# Patient Record
Sex: Female | Born: 1985 | Race: Black or African American | Hispanic: No | Marital: Married | Smoking: Former smoker
Health system: Southern US, Community
[De-identification: ages and names within clinical notes are randomized; demographics above are authoritative.]

## PROBLEM LIST (undated history)

## (undated) ENCOUNTER — Inpatient Hospital Stay (HOSPITAL_COMMUNITY): Payer: Self-pay

## (undated) DIAGNOSIS — A749 Chlamydial infection, unspecified: Secondary | ICD-10-CM

## (undated) DIAGNOSIS — D649 Anemia, unspecified: Secondary | ICD-10-CM

## (undated) DIAGNOSIS — A549 Gonococcal infection, unspecified: Secondary | ICD-10-CM

## (undated) HISTORY — PX: ECTOPIC PREGNANCY SURGERY: SHX613

---

## 2007-08-13 ENCOUNTER — Emergency Department (HOSPITAL_COMMUNITY): Admission: EM | Admit: 2007-08-13 | Discharge: 2007-08-13 | Payer: Self-pay | Admitting: Emergency Medicine

## 2007-10-27 ENCOUNTER — Emergency Department (HOSPITAL_COMMUNITY): Admission: EM | Admit: 2007-10-27 | Discharge: 2007-10-27 | Payer: Self-pay | Admitting: Emergency Medicine

## 2007-11-05 ENCOUNTER — Emergency Department (HOSPITAL_COMMUNITY): Admission: EM | Admit: 2007-11-05 | Discharge: 2007-11-05 | Payer: Self-pay | Admitting: Emergency Medicine

## 2007-11-10 ENCOUNTER — Emergency Department (HOSPITAL_COMMUNITY): Admission: EM | Admit: 2007-11-10 | Discharge: 2007-11-10 | Payer: Self-pay | Admitting: Emergency Medicine

## 2007-12-05 ENCOUNTER — Emergency Department (HOSPITAL_COMMUNITY): Admission: EM | Admit: 2007-12-05 | Discharge: 2007-12-05 | Payer: Self-pay | Admitting: Emergency Medicine

## 2008-01-24 ENCOUNTER — Emergency Department (HOSPITAL_COMMUNITY): Admission: EM | Admit: 2008-01-24 | Discharge: 2008-01-24 | Payer: Self-pay | Admitting: Emergency Medicine

## 2008-03-09 ENCOUNTER — Emergency Department (HOSPITAL_COMMUNITY): Admission: EM | Admit: 2008-03-09 | Discharge: 2008-03-09 | Payer: Self-pay | Admitting: Emergency Medicine

## 2008-03-20 ENCOUNTER — Emergency Department (HOSPITAL_COMMUNITY): Admission: EM | Admit: 2008-03-20 | Discharge: 2008-03-20 | Payer: Self-pay | Admitting: Emergency Medicine

## 2008-09-05 ENCOUNTER — Emergency Department (HOSPITAL_COMMUNITY): Admission: EM | Admit: 2008-09-05 | Discharge: 2008-09-05 | Payer: Self-pay | Admitting: Emergency Medicine

## 2009-01-27 ENCOUNTER — Encounter: Payer: Self-pay | Admitting: Emergency Medicine

## 2009-01-27 ENCOUNTER — Ambulatory Visit (HOSPITAL_COMMUNITY): Admission: AD | Admit: 2009-01-27 | Discharge: 2009-01-27 | Payer: Self-pay | Admitting: Obstetrics & Gynecology

## 2009-01-27 ENCOUNTER — Encounter: Payer: Self-pay | Admitting: Obstetrics & Gynecology

## 2009-02-19 ENCOUNTER — Emergency Department (HOSPITAL_COMMUNITY): Admission: EM | Admit: 2009-02-19 | Discharge: 2009-02-19 | Payer: Self-pay | Admitting: Emergency Medicine

## 2009-06-14 IMAGING — CR DG CHEST 2V
2 series · 2 of 2 positions shown · non-contrast
Comparison: None available

CLINICAL DATA: Cough, fever

CHEST - 2 VIEW

[w chest pa]
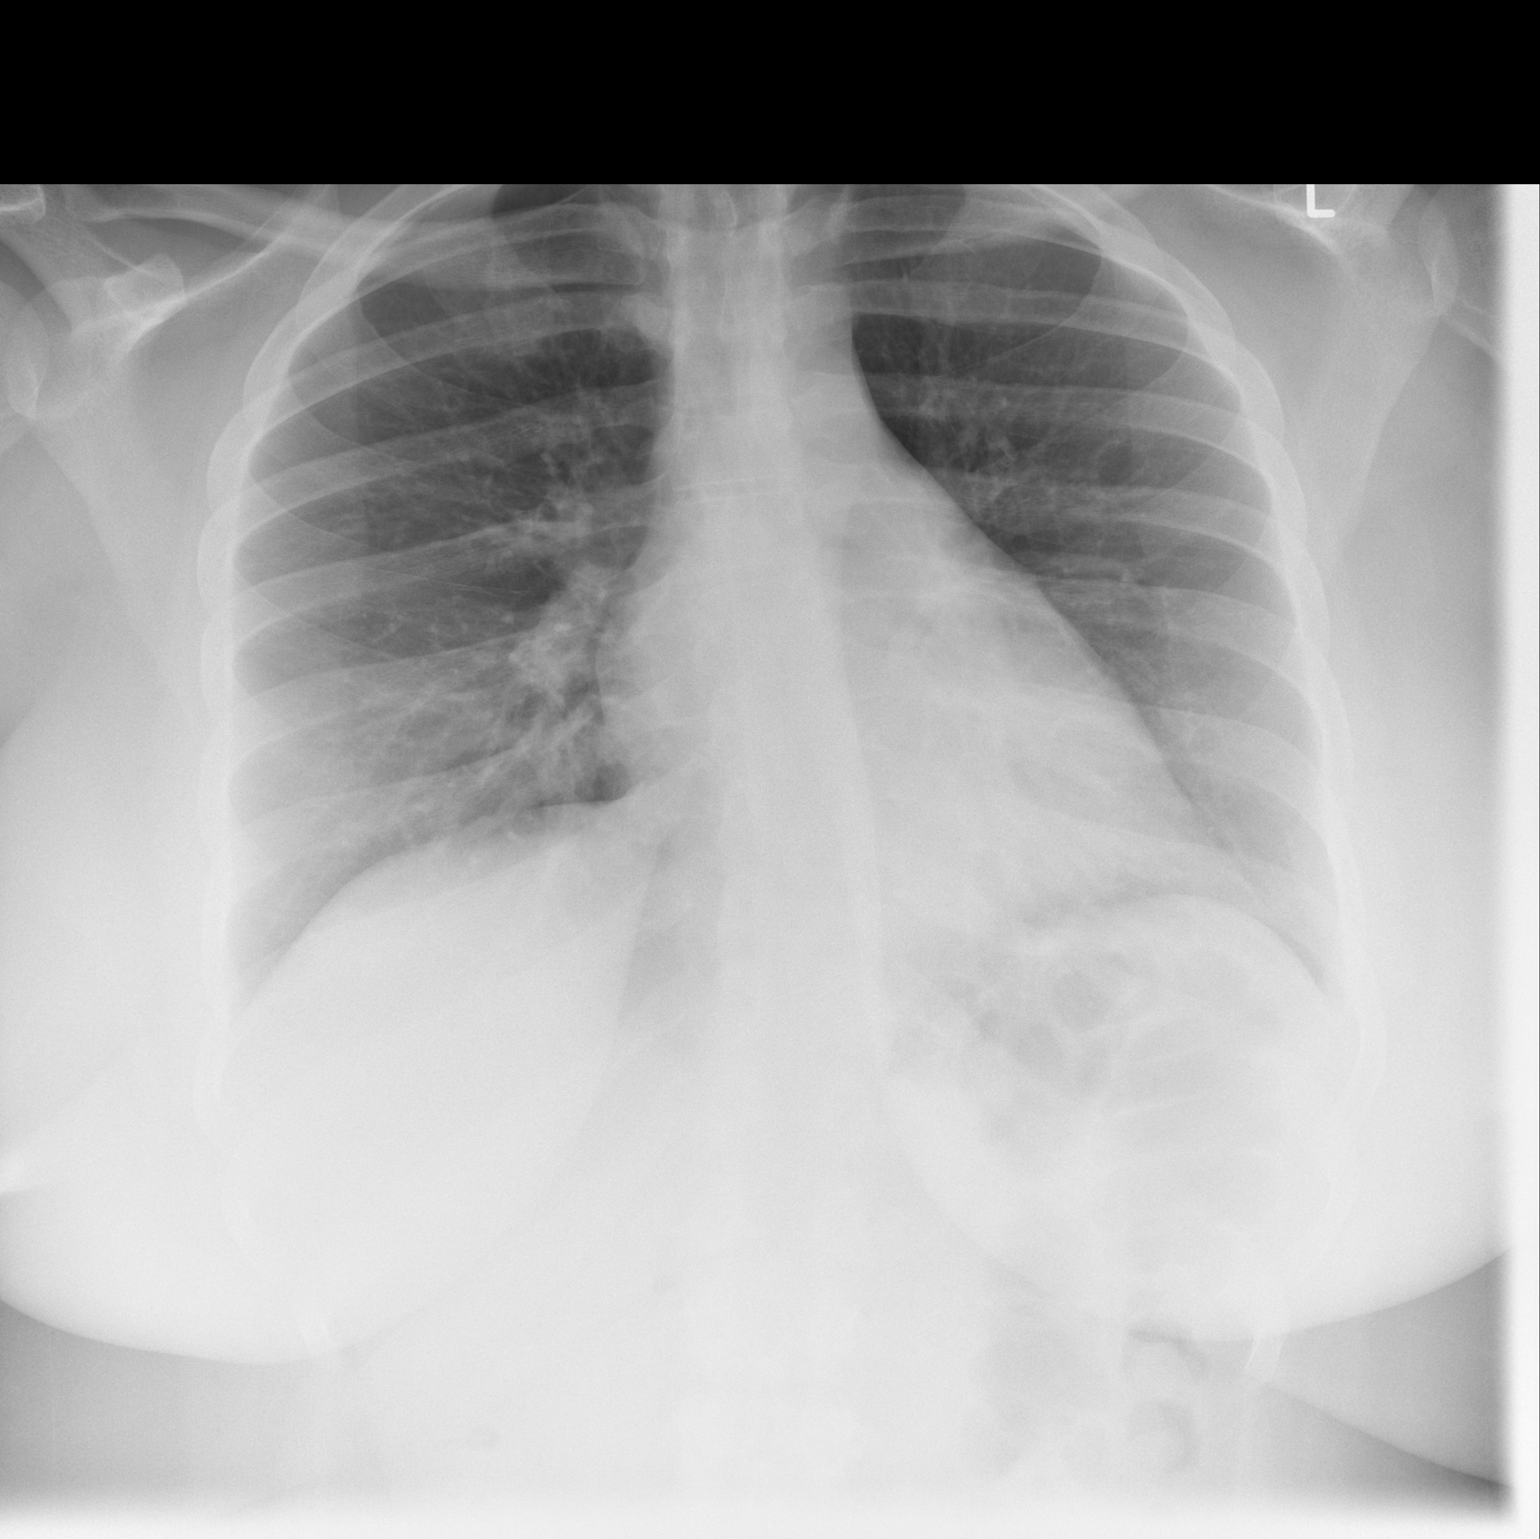

[w chest lat *]
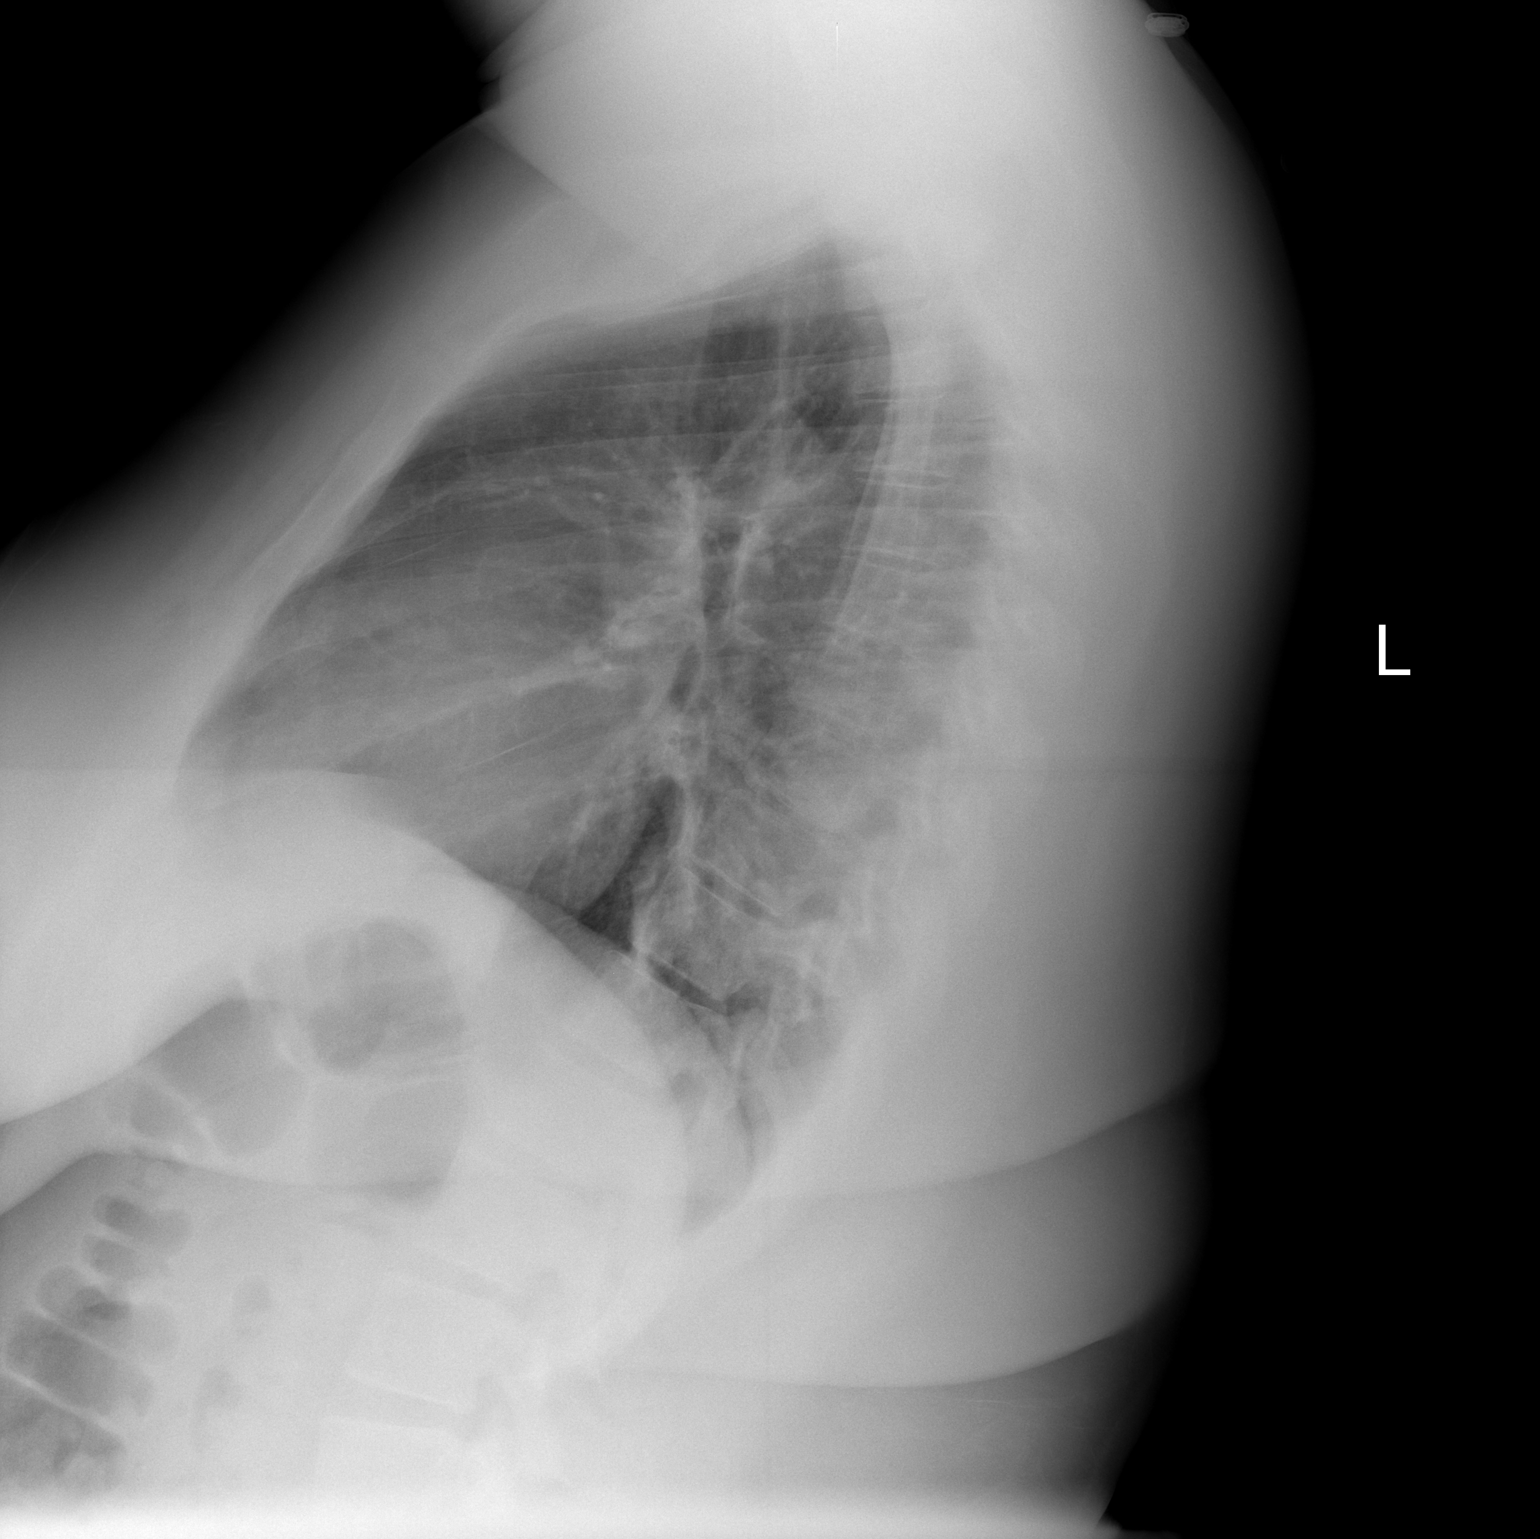

[2 of 2 positions shown; findings below may reference images not displayed]

FINDINGS: Left retrocardiac consolidation / atelectasis.  Right
lung clear.  No effusion.  Heart size normal.
IMPRESSION: 1.  Left retrocardiac consolidation or atelectasis, possibly early
pneumonia.

## 2009-10-31 ENCOUNTER — Inpatient Hospital Stay (HOSPITAL_COMMUNITY): Admission: AD | Admit: 2009-10-31 | Discharge: 2009-10-31 | Payer: Self-pay | Admitting: Obstetrics & Gynecology

## 2009-11-02 ENCOUNTER — Inpatient Hospital Stay (HOSPITAL_COMMUNITY): Admission: AD | Admit: 2009-11-02 | Discharge: 2009-11-02 | Payer: Self-pay | Admitting: Obstetrics & Gynecology

## 2009-11-07 ENCOUNTER — Ambulatory Visit (HOSPITAL_COMMUNITY): Admission: RE | Admit: 2009-11-07 | Discharge: 2009-11-07 | Payer: Self-pay | Admitting: Obstetrics & Gynecology

## 2010-11-04 LAB — GC/CHLAMYDIA PROBE AMP, GENITAL
Chlamydia, DNA Probe: NEGATIVE
GC Probe Amp, Genital: NEGATIVE

## 2010-11-04 LAB — URINALYSIS, ROUTINE W REFLEX MICROSCOPIC
Glucose, UA: NEGATIVE mg/dL
Ketones, ur: NEGATIVE mg/dL
Protein, ur: NEGATIVE mg/dL
Urobilinogen, UA: 0.2 mg/dL (ref 0.0–1.0)
pH: 6.5 (ref 5.0–8.0)

## 2010-11-04 LAB — HCG, QUANTITATIVE, PREGNANCY: hCG, Beta Chain, Quant, S: 1364 m[IU]/mL — ABNORMAL HIGH (ref <5)

## 2010-11-04 LAB — CBC
HCT: 35.9 % — ABNORMAL LOW (ref 36.0–46.0)
RBC: 4.27 MIL/uL (ref 3.87–5.11)
RDW: 14.5 % (ref 11.5–15.5)
WBC: 4.3 10*3/uL (ref 4.0–10.5)

## 2010-11-04 LAB — URINE CULTURE: Colony Count: >=100000

## 2010-11-04 LAB — POCT PREGNANCY, URINE: Preg Test, Ur: POSITIVE

## 2010-11-04 LAB — URINE MICROSCOPIC-ADD ON

## 2010-11-04 LAB — WET PREP, GENITAL: Trich, Wet Prep: NONE SEEN

## 2010-11-17 LAB — URINE MICROSCOPIC-ADD ON

## 2010-11-17 LAB — CBC
Hemoglobin: 12 g/dL (ref 12.0–15.0)
MCHC: 32.9 g/dL (ref 30.0–36.0)
Platelets: 240 10*3/uL (ref 150–400)
RDW: 14.2 % (ref 11.5–15.5)

## 2010-11-17 LAB — DIFFERENTIAL
Basophils Absolute: 0.1 10*3/uL (ref 0.0–0.1)
Basophils Relative: 1 % (ref 0–1)
Lymphocytes Relative: 35 % (ref 12–46)
Neutro Abs: 2.7 10*3/uL (ref 1.7–7.7)
Neutrophils Relative %: 56 % (ref 43–77)

## 2010-11-17 LAB — TROPONIN I: Troponin I: <0.01 ng/mL (ref 0.00–0.06)

## 2010-11-17 LAB — CK TOTAL AND CKMB (NOT AT ARMC)
CK, MB: 1 ng/mL (ref 0.3–4.0)
Relative Index: 0.6 (ref 0.0–2.5)
Total CK: 174 U/L (ref 7–177)

## 2010-11-17 LAB — WET PREP, GENITAL
Clue Cells Wet Prep HPF POC: NONE SEEN
Trich, Wet Prep: NONE SEEN

## 2010-11-17 LAB — URINE CULTURE

## 2010-11-17 LAB — URINALYSIS, ROUTINE W REFLEX MICROSCOPIC
Glucose, UA: NEGATIVE mg/dL
Ketones, ur: 15 mg/dL — AB
Nitrite: NEGATIVE
Urobilinogen, UA: 1 mg/dL (ref 0.0–1.0)

## 2010-11-17 LAB — GC/CHLAMYDIA PROBE AMP, GENITAL: Chlamydia, DNA Probe: NEGATIVE

## 2010-11-17 LAB — PROTIME-INR: Prothrombin Time: 13.8 seconds (ref 11.6–15.2)

## 2010-11-18 LAB — CBC
HCT: 39.4 % (ref 36.0–46.0)
Platelets: 177 10*3/uL (ref 150–400)
RBC: 4.73 MIL/uL (ref 3.87–5.11)
WBC: 6.7 10*3/uL (ref 4.0–10.5)

## 2010-11-18 LAB — DIFFERENTIAL
Lymphocytes Relative: 13 % (ref 12–46)
Lymphs Abs: 0.9 10*3/uL (ref 0.7–4.0)
Monocytes Relative: 5 % (ref 3–12)
Neutrophils Relative %: 81 % — ABNORMAL HIGH (ref 43–77)

## 2010-11-18 LAB — URINALYSIS, ROUTINE W REFLEX MICROSCOPIC
Glucose, UA: NEGATIVE mg/dL
Ketones, ur: NEGATIVE mg/dL
Nitrite: NEGATIVE
pH: 7 (ref 5.0–8.0)

## 2010-11-18 LAB — BASIC METABOLIC PANEL
BUN: 4 mg/dL — ABNORMAL LOW (ref 6–23)
Creatinine, Ser: 0.77 mg/dL (ref 0.4–1.2)
GFR calc Af Amer: >60 mL/min (ref >60)
GFR calc non Af Amer: >60 mL/min (ref >60)
Potassium: 3.1 mEq/L — ABNORMAL LOW (ref 3.5–5.1)

## 2010-11-18 LAB — WET PREP, GENITAL
WBC, Wet Prep HPF POC: NONE SEEN
Yeast Wet Prep HPF POC: NONE SEEN

## 2010-11-18 LAB — HCG, QUANTITATIVE, PREGNANCY: hCG, Beta Chain, Quant, S: 8157 m[IU]/mL — ABNORMAL HIGH (ref <5)

## 2010-11-18 LAB — POCT PREGNANCY, URINE: Preg Test, Ur: POSITIVE

## 2010-11-25 LAB — CBC
HCT: 35.5 % — ABNORMAL LOW (ref 36.0–46.0)
MCHC: 32.7 g/dL (ref 30.0–36.0)
MCV: 81 fL (ref 78.0–100.0)
Platelets: 215 10*3/uL (ref 150–400)

## 2010-11-25 LAB — LIPASE, BLOOD: Lipase: 18 U/L (ref 11–59)

## 2010-11-25 LAB — COMPREHENSIVE METABOLIC PANEL
AST: 17 U/L (ref 0–37)
Albumin: 2.8 g/dL — ABNORMAL LOW (ref 3.5–5.2)
BUN: 3 mg/dL — ABNORMAL LOW (ref 6–23)
CO2: 27 mEq/L (ref 19–32)
Calcium: 8.3 mg/dL — ABNORMAL LOW (ref 8.4–10.5)
Chloride: 107 mEq/L (ref 96–112)
Creatinine, Ser: 0.75 mg/dL (ref 0.4–1.2)
GFR calc Af Amer: >60 mL/min (ref >60)
GFR calc non Af Amer: >60 mL/min (ref >60)
Total Bilirubin: 0.8 mg/dL (ref 0.3–1.2)

## 2010-11-25 LAB — URINALYSIS, ROUTINE W REFLEX MICROSCOPIC
Bilirubin Urine: NEGATIVE
Glucose, UA: NEGATIVE mg/dL
Hgb urine dipstick: NEGATIVE
Ketones, ur: 40 mg/dL — AB
Nitrite: NEGATIVE
Urobilinogen, UA: 1 mg/dL (ref 0.0–1.0)
pH: 7 (ref 5.0–8.0)

## 2010-11-25 LAB — WET PREP, GENITAL: Trich, Wet Prep: NONE SEEN

## 2010-11-25 LAB — URINE MICROSCOPIC-ADD ON

## 2010-11-25 LAB — DIFFERENTIAL
Basophils Absolute: 0 10*3/uL (ref 0.0–0.1)
Eosinophils Relative: 1 % (ref 0–5)
Lymphocytes Relative: 14 % (ref 12–46)
Lymphs Abs: 0.7 10*3/uL (ref 0.7–4.0)
Neutro Abs: 3.7 10*3/uL (ref 1.7–7.7)

## 2010-12-24 NOTE — Op Note (Signed)
[name], Samantha Lewis                ACCOUNT NO.:  [id number]   MEDICAL RECORD NO.:  [id number]          PATIENT TYPE:  AMB   LOCATION:  SDC                           FACILITY:  [facility]   PHYSICIAN:  Roseanna Rainbow, M.D.DATE OF BIRTH:  [birth year]   DATE OF PROCEDURE:  01/27/2009  DATE OF DISCHARGE:                               OPERATIVE REPORT   PREOPERATIVE DIAGNOSIS:  Rule out ruptured tubal ectopic pregnancy.   POSTOPERATIVE DIAGNOSIS:  Right-sided tubal ectopic pregnancy, ruptured.   PROCEDURE:  Laparoscopic right salpingectomy.   SURGEON:  Roseanna Rainbow, MD   ANESTHESIA:  General endotracheal.   FINDINGS:  Right tubal ectopic pregnancy, ruptured small hemoperitoneum  approximately 100 mL.   SPECIMEN:  Left fallopian tube and products of conception to Pathology.   ESTIMATED BLOOD LOSS:  Minimal.   COMPLICATIONS:  None.   PROCEDURES:  The patient was taken to the operating room with an IV  running.  She was given general anesthesia.  She was placed in the  dorsal lithotomy position and prepped and draped in the usual sterile  fashion.  After a time-out had been completed, a supraumbilical skin  incision was then made with the scalpel.  Using the OptiVu, a 10-mm  trocar and sleeve were then advanced into the abdomen under direct  visualization.  A 5-mm left lower quadrant port and a 10-mm suprapubic  port were then placed under direct visualization after the abdomen had  been insufflated with CO2 gas.  A survey of the pelvic viscera revealed  the above findings.  The ampullary portion of the tube on the right was  distended with blood and products of conception.  A uterine manipulator  had not been placed in light of the possibility of an intrauterine  pregnancy.  The left adnexa was not well visualized.  Using the Enseal  bipolar device, the mesosalpinx was cauterized and transected.  The tube  was then placed in an EndoCatch.  The pelvis was copiously  irrigated.  The operative site was felt to be hemostatic.  The intra-abdominal  pressure was allowed to decrease and hemostasis was maintained.  The  specimen was removed from the abdomen with an EndoCatch.  All the  trocars and sleeves were then removed from the abdomen.  The fascial  incision for the  suprapubic port was reapproximated with a figure-of-eight suture of O  Vicryl.  The skin incisions were reapproximated using Dermabond.  At the  close of the procedure, the instrument and pack counts were said to be  correct x2.  The patient was taken to the PACU awake and in stable  condition.      Roseanna Rainbow, M.D.  Electronically Signed     LAJ/MEDQ  D:  01/27/2009  T:  01/27/2009  Job:  [id number]

## 2011-03-15 ENCOUNTER — Emergency Department (HOSPITAL_COMMUNITY)
Admission: EM | Admit: 2011-03-15 | Discharge: 2011-03-15 | Disposition: A | Discharge status: Home / Self Care | Payer: Medicaid Other | Attending: Podiatry | Admitting: Podiatry

## 2011-03-15 DIAGNOSIS — F329 Major depressive disorder, single episode, unspecified: Secondary | ICD-10-CM | POA: Insufficient documentation

## 2011-03-15 DIAGNOSIS — N898 Other specified noninflammatory disorders of vagina: Secondary | ICD-10-CM | POA: Insufficient documentation

## 2011-03-15 DIAGNOSIS — F3289 Other specified depressive episodes: Secondary | ICD-10-CM | POA: Insufficient documentation

## 2011-03-15 DIAGNOSIS — L293 Anogenital pruritus, unspecified: Secondary | ICD-10-CM | POA: Insufficient documentation

## 2011-03-15 DIAGNOSIS — N949 Unspecified condition associated with female genital organs and menstrual cycle: Secondary | ICD-10-CM | POA: Insufficient documentation

## 2011-03-15 LAB — WET PREP, GENITAL

## 2011-03-15 LAB — URINE MICROSCOPIC-ADD ON

## 2011-03-15 LAB — URINALYSIS, ROUTINE W REFLEX MICROSCOPIC
Glucose, UA: NEGATIVE mg/dL
Ketones, ur: 15 mg/dL — AB
Specific Gravity, Urine: 1.027 (ref 1.005–1.030)
pH: 5.5 (ref 5.0–8.0)

## 2011-05-01 LAB — URINALYSIS, ROUTINE W REFLEX MICROSCOPIC
Glucose, UA: NEGATIVE
Protein, ur: 30 — AB
Specific Gravity, Urine: 1.03

## 2011-05-01 LAB — URINE MICROSCOPIC-ADD ON

## 2011-05-01 LAB — GC/CHLAMYDIA PROBE AMP, GENITAL: Chlamydia, DNA Probe: NEGATIVE

## 2011-05-01 LAB — RPR: RPR Ser Ql: NONREACTIVE

## 2011-05-01 LAB — WET PREP, GENITAL

## 2011-05-05 LAB — POCT RAPID STREP A: Streptococcus, Group A Screen (Direct): NEGATIVE

## 2011-05-06 LAB — POCT I-STAT, CHEM 8
Calcium, Ion: 1.22
Glucose, Bld: 89
HCT: 37
Hemoglobin: 12.6

## 2011-05-06 LAB — URINALYSIS, ROUTINE W REFLEX MICROSCOPIC
Bilirubin Urine: NEGATIVE
Glucose, UA: NEGATIVE
Ketones, ur: NEGATIVE
pH: 6

## 2011-05-06 LAB — WET PREP, GENITAL

## 2011-05-06 LAB — URINE MICROSCOPIC-ADD ON

## 2011-05-06 LAB — GC/CHLAMYDIA PROBE AMP, GENITAL
Chlamydia, DNA Probe: POSITIVE — AB
GC Probe Amp, Genital: NEGATIVE

## 2011-05-08 LAB — WET PREP, GENITAL
Clue Cells Wet Prep HPF POC: NONE SEEN
WBC, Wet Prep HPF POC: NONE SEEN
Yeast Wet Prep HPF POC: NONE SEEN

## 2011-05-08 LAB — URINALYSIS, ROUTINE W REFLEX MICROSCOPIC
Nitrite: NEGATIVE
Specific Gravity, Urine: 1.028
pH: 5.5

## 2011-05-08 LAB — URINE MICROSCOPIC-ADD ON

## 2011-05-08 LAB — POCT PREGNANCY, URINE: Preg Test, Ur: NEGATIVE

## 2011-05-09 LAB — COMPREHENSIVE METABOLIC PANEL
AST: 18
Albumin: 2.9 — ABNORMAL LOW
Chloride: 105
Creatinine, Ser: 0.76
GFR calc Af Amer: >60
Total Bilirubin: 0.6
Total Protein: 5.7 — ABNORMAL LOW

## 2011-05-09 LAB — CBC
MCV: 77.9 — ABNORMAL LOW
Platelets: 232
WBC: 6

## 2011-05-09 LAB — WET PREP, GENITAL

## 2011-05-09 LAB — GC/CHLAMYDIA PROBE AMP, GENITAL
Chlamydia, DNA Probe: POSITIVE — AB
GC Probe Amp, Genital: NEGATIVE

## 2011-05-09 LAB — DIFFERENTIAL
Basophils Absolute: 0
Eosinophils Relative: 1
Lymphocytes Relative: 32
Lymphs Abs: 1.9
Monocytes Absolute: 0.3
Monocytes Relative: 5

## 2011-05-09 LAB — RPR: RPR Ser Ql: NONREACTIVE

## 2011-05-09 LAB — URINALYSIS, ROUTINE W REFLEX MICROSCOPIC
Ketones, ur: 15 — AB
Nitrite: NEGATIVE
Protein, ur: 30 — AB
Specific Gravity, Urine: 1.026
Urobilinogen, UA: 0.2

## 2011-05-09 LAB — POCT PREGNANCY, URINE: Preg Test, Ur: NEGATIVE

## 2011-05-09 LAB — PREGNANCY, URINE: Preg Test, Ur: NEGATIVE

## 2011-05-09 LAB — URINE MICROSCOPIC-ADD ON

## 2011-11-20 ENCOUNTER — Inpatient Hospital Stay (HOSPITAL_COMMUNITY)
Admission: AD | Admit: 2011-11-20 | Discharge: 2011-11-20 | Disposition: A | Discharge status: Home / Self Care | Payer: Medicaid Other | Attending: Obstetrics and Gynecology | Admitting: Obstetrics and Gynecology

## 2011-11-20 ENCOUNTER — Encounter (HOSPITAL_COMMUNITY): Payer: Self-pay | Admitting: *Deleted

## 2011-11-20 DIAGNOSIS — N949 Unspecified condition associated with female genital organs and menstrual cycle: Secondary | ICD-10-CM | POA: Insufficient documentation

## 2011-11-20 DIAGNOSIS — R109 Unspecified abdominal pain: Secondary | ICD-10-CM | POA: Insufficient documentation

## 2011-11-20 DIAGNOSIS — N898 Other specified noninflammatory disorders of vagina: Secondary | ICD-10-CM

## 2011-11-20 DIAGNOSIS — K589 Irritable bowel syndrome without diarrhea: Secondary | ICD-10-CM

## 2011-11-20 LAB — URINALYSIS, ROUTINE W REFLEX MICROSCOPIC
Bilirubin Urine: NEGATIVE
Hgb urine dipstick: NEGATIVE
Ketones, ur: NEGATIVE mg/dL
Nitrite: NEGATIVE
Urobilinogen, UA: 1 mg/dL (ref 0.0–1.0)

## 2011-11-20 LAB — WET PREP, GENITAL
Clue Cells Wet Prep HPF POC: NONE SEEN
Yeast Wet Prep HPF POC: NONE SEEN

## 2011-11-20 NOTE — MAU Provider Note (Signed)
  History     CSN: [id number]  Arrival date and time: 11/20/11 4098   First Provider Initiated Contact with Patient 11/20/11 0420      No chief complaint on file.  HPI This is a 26 y.o. female who presents with c/o vaginal discharge that "sometimes stinks and sometimes not".  Also c/o colicky, crampy "gas pains" all over abdomen for one month.  Worse when eats broccoli and cabbage, which she eats a lot.   LMP was 3 weeks ago. Has OB/GYN in [place], but did not call them "because they are too far away" (she lives here now). No unusual bleeding.  OB History    Grav Para Term Preterm Abortions TAB SAB Ect Mult Living   4    2 1  1  2       History reviewed. No pertinent past medical history.  Past Surgical History  Procedure Date  . Ectopic pregnancy surgery     Family History  Problem Relation Age of Onset  . Asthma Mother   . Depression Mother   . Arthritis Mother   . Hypertension Mother     History  Substance Use Topics  . Smoking status: Current Everyday Smoker  . Smokeless tobacco: Not on file  . Alcohol Use: No    Allergies:  Allergies  Allergen Reactions  . Ultram (Tramadol Hcl) Nausea And Vomiting    Prescriptions prior to admission  Medication Sig Dispense Refill  . acetaminophen (TYLENOL) 325 MG tablet Take 650 mg by mouth every 6 (six) hours as needed. For pain or headache      . ferrous sulfate 325 (65 FE) MG tablet Take 325 mg by mouth daily with breakfast.      . ibuprofen (ADVIL,MOTRIN) 200 MG tablet Take 200 mg by mouth every 6 (six) hours as needed. For pain or headache      . Prenatal Vit-Fe Fumarate-FA (PRENATAL MULTIVITAMIN) TABS Take 1 tablet by mouth daily.        ROS As listed above  Physical Exam   Blood pressure 112/64, pulse 67, temperature 99.4 F (37.4 C), temperature source Oral, resp. rate 20, height 5\' 7"  (1.702 m), weight 234 lb (106.142 kg), last menstrual period 10/31/2011.  Physical Exam  Constitutional: She is oriented  to person, place, and time. She appears well-developed and well-nourished. No distress.  HENT:  Head: Normocephalic.  Cardiovascular: Normal rate.   Respiratory: Effort normal.  GI: Soft. She exhibits no distension and no mass. There is no tenderness. There is no rebound and no guarding.  Genitourinary: Uterus normal. Vaginal discharge (thick white) found.  Musculoskeletal: Normal range of motion.  Neurological: She is alert and oriented to person, place, and time.  Skin: Skin is warm and dry.  Psychiatric: She has a normal mood and affect.    MAU Course  Procedures  MDM GC/Chl done per request.  Wet Prep:  Only WBCs   GC/Chl pending  Assessment and Plan  A:  Abdominal discomfort, probably IBS       Vaginal discharge, normal wet prep  P:  Discussed IBS and need for family doctor to fully assess      Suggest getting GYN doctor here in town  [facility] 11/20/2011, 4:27 AM

## 2011-11-20 NOTE — Discharge Instructions (Signed)
Irritable Bowel Syndrome Irritable Bowel Syndrome (IBS) is caused by a disturbance of normal bowel function. Other terms used are spastic colon, mucous colitis, and irritable colon. It does not require surgery, nor does it lead to cancer. There is no cure for IBS. But with proper diet, stress reduction, and medication, you will find that your problems (symptoms) will gradually disappear or improve. IBS is a common digestive disorder. It usually appears in late adolescence or early adulthood. Women develop it twice as often as men. CAUSES   After food has been digested and absorbed in the small intestine, waste material is moved into the colon (large intestine). In the colon, water and salts are absorbed from the undigested products coming from the small intestine. The remaining residue, or fecal material, is held for elimination. Under normal circumstances, gentle, rhythmic contractions on the bowel walls push the fecal material along the colon towards the rectum. In IBS, however, these contractions are irregular and poorly coordinated. The fecal material is either retained too long, resulting in constipation, or expelled too soon, producing diarrhea. SYMPTOMS   The most common symptom of IBS is pain. It is typically in the lower left side of the belly (abdomen). But it may occur anywhere in the abdomen. It can be felt as heartburn, backache, or even as a dull pain in the arms or shoulders. The pain comes from excessive bowel-muscle spasms and from the buildup of gas and fecal material in the colon. This pain:  Can range from sharp belly (abdominal) cramps to a dull, continuous ache.   Usually worsens soon after eating.   Is typically relieved by having a bowel movement or passing gas.  Abdominal pain is usually accompanied by constipation. But it may also produce diarrhea. The diarrhea typically occurs right after a meal or upon arising in the morning. The stools are typically soft and watery. They are  often flecked with secretions (mucus). Other symptoms of IBS include:  Bloating.   Loss of appetite.   Heartburn.   Feeling sick to your stomach (nausea).   Belching   Vomiting   Gas.  IBS may also cause a number of symptoms that are unrelated to the digestive system:  Fatigue.   Headaches.   Anxiety   Shortness of breath   Difficulty in concentrating.   Dizziness.  These symptoms tend to come and go. DIAGNOSIS   The symptoms of IBS closely mimic the symptoms of other, more serious digestive disorders. So your caregiver may wish to perform a variety of additional tests to exclude these disorders. He/she wants to be certain of learning what is wrong (diagnosis). The nature and purpose of each test will be explained to you. TREATMENT A number of medications are available to help correct bowel function and/or relieve bowel spasms and abdominal pain. Among the drugs available are:  Mild, non-irritating laxatives for severe constipation and to help restore normal bowel habits.   Specific anti-diarrheal medications to treat severe or prolonged diarrhea.   Anti-spasmodic agents to relieve intestinal cramps.   Your caregiver may also decide to treat you with a mild tranquilizer or sedative during unusually stressful periods in your life.  The important thing to remember is that if any drug is prescribed for you, make sure that you take it exactly as directed. Make sure that your caregiver knows how well it worked for you. HOME CARE INSTRUCTIONS    Avoid foods that are high in fat or oils. Some examples are:heavy cream, butter, frankfurters,   sausage, and other fatty meats.   Avoid foods that have a laxative effect, such as fruit, fruit juice, and dairy products.   Cut out carbonated drinks, chewing gum, and "gassy" foods, such as beans and cabbage. This may help relieve bloating and belching.   Bran taken with plenty of liquids may help relieve constipation.   Keep track  of what foods seem to trigger your symptoms.   Avoid emotionally charged situations or circumstances that produce anxiety.   Start or continue exercising.   Get plenty of rest and sleep.  MAKE SURE YOU:    Understand these instructions.   Will watch your condition.   Will get help right away if you are not doing well or get worse.  Document Released: 07/28/2005 Document Revised: 07/17/2011 Document Reviewed: 03/17/2008 Exit[employer] Patient Information 2012 [employer]. 

## 2011-11-20 NOTE — MAU Note (Signed)
PT SAYS SHE  STARTED HAVING ABD CRAMPS WITH CYCLE  LAST MTH AND CONTINUED.    FEELS  INCREASE GAS - WITH PAINS- EATING CABBAGE AND BROCCOLI.  SAYS VAG D/C STARTED  LAST Thursday.     GOES TO  DR Renee Ramus IN [place] [facility] .    NO FAMILY  DR.   Hayden Rasmussen CONDOMS FOR BIRTH CONTROL.

## 2011-11-20 NOTE — MAU Note (Signed)
Pt reports vaginal discharge x 1 week, also reports abd pain x 1 week that is worsening. LMP 10/31/2011

## 2011-11-21 LAB — GC/CHLAMYDIA PROBE AMP, GENITAL
Chlamydia, DNA Probe: POSITIVE — AB
GC Probe Amp, Genital: NEGATIVE

## 2011-11-22 NOTE — MAU Provider Note (Signed)
Attestation of Attending Supervision of Advanced Practitioner: Evaluation and management procedures were performed by the PA/NP/CNM/OB Fellow under my supervision/collaboration. Chart reviewed and agree with management and plan.  Keegan Ducey V 11/22/2011 2:40 PM

## 2012-05-07 ENCOUNTER — Emergency Department (HOSPITAL_COMMUNITY)
Admission: EM | Admit: 2012-05-07 | Discharge: 2012-05-07 | Disposition: A | Discharge status: Home / Self Care | Payer: Medicaid Other | Attending: Emergency Medicine | Admitting: Emergency Medicine

## 2012-05-07 ENCOUNTER — Encounter (HOSPITAL_COMMUNITY): Payer: Self-pay | Admitting: Physical Medicine and Rehabilitation

## 2012-05-07 DIAGNOSIS — N739 Female pelvic inflammatory disease, unspecified: Secondary | ICD-10-CM | POA: Insufficient documentation

## 2012-05-07 DIAGNOSIS — N730 Acute parametritis and pelvic cellulitis: Secondary | ICD-10-CM

## 2012-05-07 DIAGNOSIS — F17200 Nicotine dependence, unspecified, uncomplicated: Secondary | ICD-10-CM | POA: Insufficient documentation

## 2012-05-07 LAB — URINALYSIS, ROUTINE W REFLEX MICROSCOPIC
Glucose, UA: NEGATIVE mg/dL
Hgb urine dipstick: NEGATIVE
Protein, ur: NEGATIVE mg/dL
Specific Gravity, Urine: 1.024 (ref 1.005–1.030)
Urobilinogen, UA: 1 mg/dL (ref 0.0–1.0)

## 2012-05-07 LAB — WET PREP, GENITAL

## 2012-05-07 LAB — URINE MICROSCOPIC-ADD ON

## 2012-05-07 MED ORDER — ONDANSETRON 4 MG PO TBDP
8.0000 mg | ORAL_TABLET | Freq: Once | ORAL | Status: AC
  Administered 2012-05-07: 8 mg via ORAL
  Filled 2012-05-07: qty 2

## 2012-05-07 MED ORDER — CEFTRIAXONE SODIUM 250 MG IJ SOLR
250.0000 mg | Freq: Once | INTRAMUSCULAR | Status: AC
  Administered 2012-05-07: 250 mg via INTRAMUSCULAR
  Filled 2012-05-07: qty 250

## 2012-05-07 MED ORDER — OXYCODONE-ACETAMINOPHEN 5-325 MG PO TABS: 2.0000 | ORAL_TABLET | Freq: Four times a day (QID) | ORAL | Status: DC | PRN

## 2012-05-07 MED ORDER — LIDOCAINE HCL (PF) 1 % IJ SOLN
INTRAMUSCULAR | Status: AC
  Administered 2012-05-07: 5 mL
  Filled 2012-05-07: qty 5

## 2012-05-07 MED ORDER — DOXYCYCLINE HYCLATE 100 MG PO CAPS: 100.0000 mg | ORAL_CAPSULE | Freq: Two times a day (BID) | ORAL | Status: DC

## 2012-05-07 MED ORDER — METRONIDAZOLE 500 MG PO TABS: 500.0000 mg | ORAL_TABLET | Freq: Two times a day (BID) | ORAL | Status: DC

## 2012-05-07 MED ORDER — AZITHROMYCIN 1 G PO PACK
1.0000 g | PACK | Freq: Once | ORAL | Status: AC
  Administered 2012-05-07: 1 g via ORAL
  Filled 2012-05-07: qty 1

## 2012-05-07 NOTE — ED Notes (Signed)
Pt presents to department for evaluation of lower abdominal cramping. Ongoing x2 weeks. Denies urinary symptoms. Denies vaginal symptoms. LMP: 04/13/12. No signs of distress noted at the time.

## 2012-05-07 NOTE — ED Provider Notes (Signed)
History   This chart was scribed for Hurman Horn, MD by Melba Coon. The patient was seen in room TR06C/TR06C and the patient's care was started at 3:23PM.    CSN: [id number]  Arrival date & time 05/07/12  1224   First MD Initiated Contact with Patient 05/07/12 1518      Chief Complaint  Patient presents with  . Abdominal Pain    (Consider location/radiation/quality/duration/timing/severity/associated sxs/prior treatment) The history is provided by the patient. No language interpreter was used.   Samantha Lewis is a 26 y.o. female who presents to the Emergency Department complaining of waxing and waning, moderate to severe lower abdominal pain with an onset 2 weeks ago that is alleviated or aggravated by nothing. The pain does not radiate away from the lower abd and the pain does not go away. Denies nausea, vomit. Denies vaginal d/c and vaginal bleeding. Denies sudden or colicky pain. Hx of right uterine tube taken out for ectopic pregnancy. No Hx of IBS. No other pertinent medical symptoms.  No past medical history on file.  Past Surgical History  Procedure Date  . Ectopic pregnancy surgery     Family History  Problem Relation Age of Onset  . Asthma Mother   . Depression Mother   . Arthritis Mother   . Hypertension Mother     History  Substance Use Topics  . Smoking status: Current Every Day Smoker    Types: Cigarettes  . Smokeless tobacco: Not on file  . Alcohol Use: No    OB History    Grav Para Term Preterm Abortions TAB SAB Ect Mult Living   4    2 1  1  2       Review of Systems  Gastrointestinal: Positive for abdominal pain.  10 Systems reviewed and all are negative for acute change except as noted in the HPI.    Allergies  Ultram  Home Medications   Current Outpatient Rx  Name Route Sig Dispense Refill  . IBUPROFEN 200 MG PO TABS Oral Take 200 mg by mouth every 6 (six) hours as needed. For pain or headache    . PRENATAL MULTIVITAMIN CH Oral  Take 1 tablet by mouth daily.    Marland Kitchen DOXYCYCLINE HYCLATE 100 MG PO CAPS Oral Take 1 capsule (100 mg total) by mouth 2 (two) times daily. One po bid x 10 days 20 capsule 0  . METRONIDAZOLE 500 MG PO TABS Oral Take 1 tablet (500 mg total) by mouth 2 (two) times daily. One po bid x 10 days 20 tablet 0  . OXYCODONE-ACETAMINOPHEN 5-325 MG PO TABS Oral Take 2 tablets by mouth every 6 (six) hours as needed for pain. 20 tablet 0    BP 122/53  Pulse 67  Temp 99.4 F (37.4 C) (Oral)  Resp 16  SpO2 100%  Physical Exam  Nursing note and vitals reviewed. Constitutional: She is oriented to person, place, and time. She appears well-developed and well-nourished. No distress.  HENT:  Head: Normocephalic and atraumatic.  Eyes: EOM are normal.  Neck: Neck supple. No tracheal deviation present.  Cardiovascular: Normal rate.   Pulmonary/Chest: Effort normal. No respiratory distress.  Abdominal: Soft. She exhibits no distension and no mass. There is tenderness (minimal diffuse lower abd tenderness). There is no rebound and no guarding.       No upper abd or CVA tenderness.  Genitourinary:       Pelvic Exam: Chaperone present. Mild purulent vaginal d/c, mild cervcial motion tenderness,  bilateral adnexal tenderness.  Musculoskeletal: Normal range of motion.  Neurological: She is alert and oriented to person, place, and time.  Skin: Skin is warm and dry.  Psychiatric: She has a normal mood and affect. Her behavior is normal.    ED Course  Procedures (including critical care time)   COORDINATION OF CARE:  3:28PM - lab results reviewed. Pelvic exam will be performed on [name]. 4:00PM - pelvic exam performed and noted in the PE section. [name] will receive Zithromax and rocephin here at the ED. 4:30PM - recheck; [name]' condition has improved; is ready for d/c.    Labs Reviewed  URINALYSIS, ROUTINE W REFLEX MICROSCOPIC - Abnormal; Notable for the following:    Color, Urine AMBER (*)   BIOCHEMICALS MAY BE AFFECTED BY COLOR   APPearance CLOUDY (*)     Bilirubin Urine SMALL (*)     Leukocytes, UA SMALL (*)     All other components within normal limits  URINE MICROSCOPIC-ADD ON - Abnormal; Notable for the following:    Squamous Epithelial / LPF MANY (*)     Bacteria, UA MANY (*)     All other components within normal limits  WET PREP, GENITAL - Abnormal; Notable for the following:    Yeast Wet Prep HPF POC FEW (*)     WBC, Wet Prep HPF POC FEW (*)     All other components within normal limits  POCT PREGNANCY, URINE  GC/CHLAMYDIA PROBE AMP, GENITAL   No results found.   1. PID (acute pelvic inflammatory disease)       MDM  I personally performed the services described in this documentation, which was scribed in my presence. The recorded information has been reviewed and considered. Pt stable in ED with no significant deterioration in condition.Patient / Family / Caregiver informed of clinical course, understand medical decision-making process, and agree with plan.I doubt any other EMC precluding discharge at this time including, but not necessarily limited to the following:sepsis, peritonitis.      Hurman Horn, MD 05/08/12 0930

## 2012-05-11 LAB — GC/CHLAMYDIA PROBE AMP, GENITAL
Chlamydia, DNA Probe: NEGATIVE
GC Probe Amp, Genital: POSITIVE — AB

## 2012-05-12 NOTE — ED Notes (Signed)
+   Gonorrhea Patient treated with Rocephin and Zithromax. DHHS letter faxed 

## 2012-05-13 ENCOUNTER — Telehealth (HOSPITAL_COMMUNITY): Payer: Self-pay | Admitting: *Deleted

## 2012-05-13 NOTE — ED Notes (Signed)
Patient informed of positive results after id'd x 2 and informed of need to notify partner to be treated. 

## 2013-06-11 ENCOUNTER — Inpatient Hospital Stay (HOSPITAL_COMMUNITY)
Admission: AD | Admit: 2013-06-11 | Discharge: 2013-06-11 | Disposition: A | Discharge status: Home / Self Care | Payer: Medicaid Other | Source: Ambulatory Visit | Attending: Obstetrics and Gynecology | Admitting: Obstetrics and Gynecology

## 2013-06-11 ENCOUNTER — Encounter (HOSPITAL_COMMUNITY): Payer: Self-pay | Admitting: *Deleted

## 2013-06-11 DIAGNOSIS — B373 Candidiasis of vulva and vagina: Secondary | ICD-10-CM | POA: Insufficient documentation

## 2013-06-11 DIAGNOSIS — R300 Dysuria: Secondary | ICD-10-CM | POA: Insufficient documentation

## 2013-06-11 DIAGNOSIS — B3731 Acute candidiasis of vulva and vagina: Secondary | ICD-10-CM | POA: Insufficient documentation

## 2013-06-11 DIAGNOSIS — N949 Unspecified condition associated with female genital organs and menstrual cycle: Secondary | ICD-10-CM | POA: Insufficient documentation

## 2013-06-11 HISTORY — DX: Chlamydial infection, unspecified: A74.9

## 2013-06-11 HISTORY — DX: Gonococcal infection, unspecified: A54.9

## 2013-06-11 LAB — WET PREP, GENITAL
Clue Cells Wet Prep HPF POC: NONE SEEN
Yeast Wet Prep HPF POC: NONE SEEN

## 2013-06-11 LAB — URINALYSIS, ROUTINE W REFLEX MICROSCOPIC
Bilirubin Urine: NEGATIVE
Glucose, UA: NEGATIVE mg/dL
Hgb urine dipstick: NEGATIVE
Nitrite: NEGATIVE
Specific Gravity, Urine: >1.03 — ABNORMAL HIGH (ref 1.005–1.030)
pH: 6 (ref 5.0–8.0)

## 2013-06-11 MED ORDER — TERCONAZOLE 0.4 % VA CREA: 1.0000 | TOPICAL_CREAM | Freq: Every day | VAGINAL | Status: DC

## 2013-06-11 NOTE — MAU Provider Note (Signed)
History     CSN: [id number]  Arrival date and time: 06/11/13 1250   First Provider Initiated Contact with Patient 06/11/13 1347      Chief Complaint  Patient presents with  . vaginal irritation    HPI Samantha Lewis 27 y.o. Comes to MAU with vaginal irritation for 3 days.   Having dysuria and wants an STD check.  Thinks she might have a yeast infection as she is having lots of itching.  OB History   Grav Para Term Preterm Abortions TAB SAB Ect Mult Living   4    2 1  1  2       Past Medical History  Diagnosis Date  . Chlamydia   . Gonorrhea     Past Surgical History  Procedure Laterality Date  . Ectopic pregnancy surgery      Family History  Problem Relation Age of Onset  . Asthma Mother   . Depression Mother   . Arthritis Mother   . Hypertension Mother     History  Substance Use Topics  . Smoking status: Former Smoker    Types: Cigarettes  . Smokeless tobacco: Not on file  . Alcohol Use: No    Allergies:  Allergies  Allergen Reactions  . Ultram [Tramadol Hcl] Nausea Only    Prescriptions prior to admission  Medication Sig Dispense Refill  . doxycycline (VIBRAMYCIN) 100 MG capsule Take 1 capsule (100 mg total) by mouth 2 (two) times daily. One po bid x 10 days  20 capsule  0  . ibuprofen (ADVIL,MOTRIN) 200 MG tablet Take 200 mg by mouth every 6 (six) hours as needed. For pain or headache      . metroNIDAZOLE (FLAGYL) 500 MG tablet Take 1 tablet (500 mg total) by mouth 2 (two) times daily. One po bid x 10 days  20 tablet  0  . oxyCODONE-acetaminophen (PERCOCET) 5-325 MG per tablet Take 2 tablets by mouth every 6 (six) hours as needed for pain.  20 tablet  0  . Prenatal Vit-Fe Fumarate-FA (PRENATAL MULTIVITAMIN) TABS Take 1 tablet by mouth daily.        Review of Systems  Constitutional: Negative for fever.  Gastrointestinal: Negative for nausea, vomiting, abdominal pain and diarrhea.  Genitourinary: Positive for dysuria. Negative for urgency and  frequency.       Vaginal discharge. No vaginal bleeding.   Physical Exam   Blood pressure 117/65, pulse 83, temperature 98.7 F (37.1 C), height 5' 7.5" (1.715 m), weight 198 lb (89.812 kg), last menstrual period 05/15/2013.  Physical Exam  Nursing note and vitals reviewed. Constitutional: She is oriented to person, place, and time. She appears well-developed and well-nourished.  HENT:  Head: Normocephalic.  Eyes: EOM are normal.  Neck: Neck supple.  GI: Soft. There is no tenderness.  Genitourinary:  Speculum exam: Vulva - some swelling noted of labia minora Vagina - Large  amount of grainy discharge, no odor Cervix - No contact bleeding Bimanual exam: Cervix closed Uterus non tender, normal size Adnexa non tender, no masses bilaterally GC/Chlam, wet prep done Chaperone present for exam.  Musculoskeletal: Normal range of motion.  Neurological: She is alert and oriented to person, place, and time.  Skin: Skin is warm and dry.  Psychiatric: She has a normal mood and affect.    MAU Course  Procedures  MDM Results for orders placed during the hospital encounter of 06/11/13 (from the past 24 hour(s))  URINALYSIS, ROUTINE W REFLEX MICROSCOPIC  Status: Abnormal   Collection Time    06/11/13  1:10 PM      Result Value Range   Color, Urine YELLOW  YELLOW   APPearance HAZY (*) CLEAR   Specific Gravity, Urine >1.030 (*) 1.005 - 1.030   pH 6.0  5.0 - 8.0   Glucose, UA NEGATIVE  NEGATIVE mg/dL   Hgb urine dipstick NEGATIVE  NEGATIVE   Bilirubin Urine NEGATIVE  NEGATIVE   Ketones, ur NEGATIVE  NEGATIVE mg/dL   Protein, ur NEGATIVE  NEGATIVE mg/dL   Urobilinogen, UA 0.2  0.0 - 1.0 mg/dL   Nitrite NEGATIVE  NEGATIVE   Leukocytes, UA NEGATIVE  NEGATIVE  POCT PREGNANCY, URINE     Status: None   Collection Time    06/11/13  1:17 PM      Result Value Range   Preg Test, Ur NEGATIVE  NEGATIVE  WET PREP, GENITAL     Status: Abnormal   Collection Time    06/11/13  1:42 PM       Result Value Range   Yeast Wet Prep HPF POC NONE SEEN  NONE SEEN   Trich, Wet Prep NONE SEEN  NONE SEEN   Clue Cells Wet Prep HPF POC NONE SEEN  NONE SEEN   WBC, Wet Prep HPF POC FEW (*) NONE SEEN     Assessment and Plan  Clinical Yeast infection  Plan Will eprescribe Terazol vaginal cream for treatment Follow up with your doctor as needed STD cultures are pending.  [name] 06/11/2013, 1:47 PM

## 2013-06-11 NOTE — MAU Note (Signed)
Pt presents with complaints of vaginal irritation that started about 3 days ago.

## 2013-06-12 NOTE — MAU Provider Note (Signed)
Attestation of Attending Supervision of Advanced Practitioner (CNM/NP): Evaluation and management procedures were performed by the Advanced Practitioner under my supervision and collaboration.  I have reviewed the Advanced Practitioner's note and chart, and I agree with the management and plan.  Samantha Lewis 06/12/2013 4:05 PM

## 2013-06-13 LAB — GC/CHLAMYDIA PROBE AMP
CT Probe RNA: NEGATIVE
GC Probe RNA: NEGATIVE

## 2013-10-11 ENCOUNTER — Inpatient Hospital Stay (HOSPITAL_COMMUNITY)
Admission: AD | Admit: 2013-10-11 | Discharge: 2013-10-12 | Disposition: A | Discharge status: Home / Self Care | Payer: Medicaid Other | Source: Ambulatory Visit | Attending: Obstetrics and Gynecology | Admitting: Obstetrics and Gynecology

## 2013-10-11 DIAGNOSIS — A499 Bacterial infection, unspecified: Secondary | ICD-10-CM | POA: Insufficient documentation

## 2013-10-11 DIAGNOSIS — N854 Malposition of uterus: Secondary | ICD-10-CM | POA: Insufficient documentation

## 2013-10-11 DIAGNOSIS — Z87891 Personal history of nicotine dependence: Secondary | ICD-10-CM | POA: Insufficient documentation

## 2013-10-11 DIAGNOSIS — N760 Acute vaginitis: Secondary | ICD-10-CM | POA: Insufficient documentation

## 2013-10-11 DIAGNOSIS — B9689 Other specified bacterial agents as the cause of diseases classified elsewhere: Secondary | ICD-10-CM | POA: Insufficient documentation

## 2013-10-11 NOTE — MAU Note (Signed)
Pt reports brown discharge with an odor for the last 3 days. Denies pain

## 2013-10-12 ENCOUNTER — Encounter (HOSPITAL_COMMUNITY): Payer: Self-pay

## 2013-10-12 DIAGNOSIS — A499 Bacterial infection, unspecified: Secondary | ICD-10-CM

## 2013-10-12 DIAGNOSIS — B9689 Other specified bacterial agents as the cause of diseases classified elsewhere: Secondary | ICD-10-CM

## 2013-10-12 DIAGNOSIS — N760 Acute vaginitis: Secondary | ICD-10-CM

## 2013-10-12 LAB — WET PREP, GENITAL
TRICH WET PREP: NONE SEEN
Yeast Wet Prep HPF POC: NONE SEEN

## 2013-10-12 LAB — POCT PREGNANCY, URINE: PREG TEST UR: NEGATIVE

## 2013-10-12 LAB — GC/CHLAMYDIA PROBE AMP
CT PROBE, AMP APTIMA: NEGATIVE
GC Probe RNA: NEGATIVE

## 2013-10-12 MED ORDER — METRONIDAZOLE 500 MG PO TABS: 500.0000 mg | ORAL_TABLET | Freq: Two times a day (BID) | ORAL | Status: DC

## 2013-10-12 NOTE — MAU Provider Note (Signed)
Chief Complaint:  Vaginal Discharge   First Provider Initiated Contact with Patient 10/12/13 0024     HPI: Samantha Lewis is a 28 y.o. [id number] female who presents to maternity admissions reporting light brown vaginal discharge w/ odor x 3 days.   Denies fever, chills, abdominal pain, dyspareunia, urinary complaints. Sexually active.  Past Medical History: Past Medical History  Diagnosis Date  . Chlamydia   . Gonorrhea     Past obstetric history: OB History  Gravida Para Term Preterm AB SAB TAB Ectopic Multiple Living  4 2 2  2  1 1  2     # Outcome Date GA Lbr Len/2nd Weight Sex Delivery Anes PTL Lv  4 TAB           3 ECT           2 TRM      SVD   Y  1 TRM      SVD   Y      Past Surgical History: Past Surgical History  Procedure Laterality Date  . Ectopic pregnancy surgery       Family History: Family History  Problem Relation Age of Onset  . Asthma Mother   . Depression Mother   . Arthritis Mother   . Hypertension Mother     Social History: History  Substance Use Topics  . Smoking status: Former Smoker    Types: Cigarettes  . Smokeless tobacco: Not on file  . Alcohol Use: No    Allergies:  Allergies  Allergen Reactions  . Ultram [Tramadol Hcl] Nausea Only    Meds:  Prescriptions prior to admission  Medication Sig Dispense Refill  . Prenatal Vit-Fe Fumarate-FA (PRENATAL MULTIVITAMIN) TABS Take 1 tablet by mouth daily.        ROS: Pertinent findings in history of present illness.  Physical Exam  Blood pressure 118/52, pulse 64, temperature 98.6 F (37 C), temperature source Oral, resp. rate 16, height 5' 6.5" (1.689 m), weight 83.462 kg (184 lb), last menstrual period 10/02/2013, SpO2 100.00%. GENERAL: Well-developed, well-nourished female in no acute distress.  HEENT: normocephalic HEART: normal rate RESP: normal effort ABDOMEN: Soft, non-tender, gravid appropriate for gestational age EXTREMITIES: Nontender, no edema NEURO: alert and  oriented SPECULUM EXAM: NEFG, small amount of thin, white, malodorous discharge, no blood, cervix with 0.5 cm ectropion. Nonfriable. No mucopurulent discharge. BIMANUAL EXAM: Cervix closed, anterior. Uterus nontender, normal size, retroverted. No cervical motion tenderness. No adnexal masses or tenderness.   Labs: Results for orders placed during the hospital encounter of 10/11/13 (from the past 24 hour(s))  POCT PREGNANCY, URINE     Status: None   Collection Time    10/12/13 12:04 AM      Result Value Ref Range   Preg Test, Ur NEGATIVE  NEGATIVE  WET PREP, GENITAL     Status: Abnormal   Collection Time    10/12/13 12:30 AM      Result Value Ref Range   Yeast Wet Prep HPF POC NONE SEEN  NONE SEEN   Trich, Wet Prep NONE SEEN  NONE SEEN   Clue Cells Wet Prep HPF POC FEW (*) NONE SEEN   WBC, Wet Prep HPF POC FEW (*) NONE SEEN    Imaging:  No results found. MAU Course:   Assessment: 1. BV (bacterial vaginosis)    Plan: Discharge home in stable condition. Gonorrhea and Chlamydia cultures pending.     Follow-up Information   Follow up with Gynecologist. (As needed, If  symptoms worsen and for routine care)       [facility]. (As needed in emergencies)    Contact information:   [address] [id number] [place] [id number] [phone]       Medication List         metroNIDAZOLE 500 MG tablet  Commonly known as:  FLAGYL  Take 1 tablet (500 mg total) by mouth 2 (two) times daily.     prenatal multivitamin Tabs tablet  Take 1 tablet by mouth daily.       [place] Ryleeann Urquiza, CNM 10/12/2013 1:21 AM

## 2013-10-12 NOTE — Discharge Instructions (Signed)

## 2013-10-17 NOTE — MAU Provider Note (Signed)
Attestation of Attending Supervision of Advanced Practitioner (CNM/NP): Evaluation and management procedures were performed by the Advanced Practitioner under my supervision and collaboration.  I have reviewed the Advanced Practitioner's note and chart, and I agree with the management and plan.  Alyzabeth Pontillo 10/17/2013 12:54 PM   

## 2014-02-05 ENCOUNTER — Encounter (HOSPITAL_COMMUNITY): Payer: Self-pay

## 2014-02-05 ENCOUNTER — Inpatient Hospital Stay (HOSPITAL_COMMUNITY)
Admission: AD | Admit: 2014-02-05 | Discharge: 2014-02-05 | Disposition: A | Discharge status: Home / Self Care | Payer: Medicaid Other | Source: Ambulatory Visit | Attending: Obstetrics & Gynecology | Admitting: Obstetrics & Gynecology

## 2014-02-05 DIAGNOSIS — N949 Unspecified condition associated with female genital organs and menstrual cycle: Secondary | ICD-10-CM | POA: Diagnosis present

## 2014-02-05 DIAGNOSIS — Z87891 Personal history of nicotine dependence: Secondary | ICD-10-CM | POA: Diagnosis not present

## 2014-02-05 DIAGNOSIS — K6289 Other specified diseases of anus and rectum: Secondary | ICD-10-CM | POA: Insufficient documentation

## 2014-02-05 DIAGNOSIS — N72 Inflammatory disease of cervix uteri: Secondary | ICD-10-CM | POA: Insufficient documentation

## 2014-02-05 HISTORY — DX: Anemia, unspecified: D64.9

## 2014-02-05 LAB — URINALYSIS, ROUTINE W REFLEX MICROSCOPIC
Bilirubin Urine: NEGATIVE
GLUCOSE, UA: NEGATIVE mg/dL
Ketones, ur: NEGATIVE mg/dL
LEUKOCYTES UA: NEGATIVE
Nitrite: NEGATIVE
PH: 8 (ref 5.0–8.0)
PROTEIN: NEGATIVE mg/dL
Specific Gravity, Urine: 1.01 (ref 1.005–1.030)
Urobilinogen, UA: 2 mg/dL — ABNORMAL HIGH (ref 0.0–1.0)

## 2014-02-05 LAB — WET PREP, GENITAL
TRICH WET PREP: NONE SEEN
YEAST WET PREP: NONE SEEN

## 2014-02-05 LAB — URINE MICROSCOPIC-ADD ON

## 2014-02-05 LAB — POCT PREGNANCY, URINE: PREG TEST UR: NEGATIVE

## 2014-02-05 MED ORDER — IBUPROFEN 800 MG PO TABS
800.0000 mg | ORAL_TABLET | Freq: Once | ORAL | Status: AC
  Administered 2014-02-05: 800 mg via ORAL
  Filled 2014-02-05: qty 1

## 2014-02-05 MED ORDER — AZITHROMYCIN 250 MG PO TABS
1000.0000 mg | ORAL_TABLET | Freq: Once | ORAL | Status: AC
  Administered 2014-02-05: 1000 mg via ORAL
  Filled 2014-02-05: qty 4

## 2014-02-05 MED ORDER — CEFTRIAXONE SODIUM 250 MG IJ SOLR
250.0000 mg | Freq: Once | INTRAMUSCULAR | Status: AC
  Administered 2014-02-05: 250 mg via INTRAMUSCULAR
  Filled 2014-02-05: qty 250

## 2014-02-05 NOTE — MAU Provider Note (Signed)

## 2014-02-05 NOTE — MAU Provider Note (Signed)
History     CSN: [id number]  Arrival date and time: 02/05/14 [id number]   First Provider Initiated Contact with Patient 02/05/14 [phone]      Chief Complaint  Patient presents with  . Pelvic Pain  . Rectal Pain   Pelvic Pain The patient's primary symptoms include pelvic pain.    Samantha Lewis is a 28 y.o. [phone] who presents today with abdominal pain. She states that when she woke up this morning she had abdominal pain. She denies any VB or vaginal discharge. She states that the pain is similar to when she had an ectopic pregnancy. She has not taken a pregnancy test at home. She has not taken anything for the pain at home prior to coming here.   Past Medical History  Diagnosis Date  . Chlamydia   . Gonorrhea   . Anemia     Past Surgical History  Procedure Laterality Date  . Ectopic pregnancy surgery      Family History  Problem Relation Age of Onset  . Asthma Mother   . Depression Mother   . Arthritis Mother   . Hypertension Mother     History  Substance Use Topics  . Smoking status: Former Smoker    Types: Cigarettes  . Smokeless tobacco: Not on file  . Alcohol Use: Yes     Comment: occas drink    Allergies:  Allergies  Allergen Reactions  . Ultram [Tramadol Hcl] Nausea Only    Prescriptions prior to admission  Medication Sig Dispense Refill  . naproxen sodium (ANAPROX) 220 MG tablet Take 220 mg by mouth 2 (two) times daily with a meal.        Review of Systems  Genitourinary: Positive for pelvic pain.   Physical Exam   Blood pressure 127/70, pulse 85, temperature 97.8 F (36.6 C), temperature source Oral, resp. rate 18, last menstrual period 01/20/2014.  Physical Exam  Nursing note and vitals reviewed. Constitutional: She is oriented to person, place, and time. She appears well-developed and well-nourished. No distress.  Cardiovascular: Normal rate.   Respiratory: Effort normal.  GI: Soft. There is no tenderness. There is no rebound.   Genitourinary:   External: no lesion Vagina: small amount of frothy clear discharge  Cervix: pink, smooth, +CMT  Uterus: NSSC Adnexa: NT   Neurological: She is alert and oriented to person, place, and time.  Skin: Skin is warm and dry.  Psychiatric: She has a normal mood and affect.    MAU Course  Procedures  Results for orders placed during the hospital encounter of 02/05/14 (from the past 24 hour(s))  URINALYSIS, ROUTINE W REFLEX MICROSCOPIC     Status: Abnormal   Collection Time    02/05/14  6:50 AM      Result Value Ref Range   Color, Urine YELLOW  YELLOW   APPearance CLEAR  CLEAR   Specific Gravity, Urine 1.010  1.005 - 1.030   pH 8.0  5.0 - 8.0   Glucose, UA NEGATIVE  NEGATIVE mg/dL   Hgb urine dipstick SMALL (*) NEGATIVE   Bilirubin Urine NEGATIVE  NEGATIVE   Ketones, ur NEGATIVE  NEGATIVE mg/dL   Protein, ur NEGATIVE  NEGATIVE mg/dL   Urobilinogen, UA 2.0 (*) 0.0 - 1.0 mg/dL   Nitrite NEGATIVE  NEGATIVE   Leukocytes, UA NEGATIVE  NEGATIVE  URINE MICROSCOPIC-ADD ON     Status: None   Collection Time    02/05/14  6:50 AM      Result Value Ref  Range   Squamous Epithelial / LPF RARE  RARE   RBC / HPF 3-6  <3 RBC/hpf  POCT PREGNANCY, URINE     Status: None   Collection Time    02/05/14  6:57 AM      Result Value Ref Range   Preg Test, Ur NEGATIVE  NEGATIVE     Assessment and Plan   1. Cervicitis    Treated with Azithromycin and Rocephin here in MAU Return to MAU as needed GC/CT pending, discussed partner treatment if positive.   Tawnya CrookHogan, Flossie Wexler Donovan 02/05/2014, 7:49 AM

## 2014-02-05 NOTE — MAU Note (Signed)
Felt like had to have BM and started having rectal pain and pressure.  Also pelvic pain also.  Denies bleeding.  Denies pregnancy.  LMP was in June.

## 2014-02-05 NOTE — Discharge Instructions (Signed)
Cervicitis °Cervicitis is a soreness and swelling (inflammation) of the cervix. Your cervix is located at the bottom of your uterus. It opens up to the vagina. °CAUSES  °· Sexually transmitted infections (STIs).   °· Allergic reaction.   °· Medicines or birth control devices that are put in the vagina.   °· Injury to the cervix.   °· Bacterial infections.   °RISK FACTORS °You are at greater risk if you: °· Have unprotected sexual intercourse. °· Have sexual intercourse with many partners. °· Began sexual intercourse at an early age. °· Have a history of STIs. °SYMPTOMS  °There may be no symptoms. If symptoms occur, they may include:  °· Gray, white, yellow, or bad-smelling vaginal discharge.   °· Pain or itching of the area outside the vagina.   °· Painful sexual intercourse.   °· Lower abdominal or lower back pain, especially during intercourse.   °· Frequent urination.   °· Abnormal vaginal bleeding between periods, after sexual intercourse, or after menopause.   °· Pressure or a heavy feeling in the pelvis.   °DIAGNOSIS  °Diagnosis is made after a pelvic exam. Other tests may include:  °· Examination of any discharge under a microscope (wet prep).   °· A Pap test.   °TREATMENT  °Treatment will depend on the cause of cervicitis. If it is caused by an STI, both you and your partner will need to be treated. Antibiotic medicines will be given.  °HOME CARE INSTRUCTIONS  °· Do not have sexual intercourse until your health care provider says it is okay.   °· Do not have sexual intercourse until your partner has been treated, if your cervicitis is caused by an STI.   °· Take your antibiotics as directed. Finish them even if you start to feel better.   °SEEK MEDICAL CARE IF: °· Your symptoms come back.   °· You have a fever.   °MAKE SURE YOU:  °· Understand these instructions. °· Will watch your condition. °· Will get help right away if you are not doing well or get worse. °Document Released: 07/28/2005 Document Revised:  08/02/2013 Document Reviewed: 01/19/2013 °Exit[employer]® Patient Information ©2015 [employer]. This information is not intended to replace advice given to you by your health care provider. Make sure you discuss any questions you have with your health care provider. ° °

## 2014-02-06 LAB — GC/CHLAMYDIA PROBE AMP
CT PROBE, AMP APTIMA: NEGATIVE
GC Probe RNA: NEGATIVE

## 2014-03-22 ENCOUNTER — Inpatient Hospital Stay (HOSPITAL_COMMUNITY)
Admission: AD | Admit: 2014-03-22 | Discharge: 2014-03-22 | Disposition: A | Discharge status: Home / Self Care | Payer: Medicaid Other | Source: Ambulatory Visit | Attending: Obstetrics & Gynecology | Admitting: Obstetrics & Gynecology

## 2014-03-22 DIAGNOSIS — Z3201 Encounter for pregnancy test, result positive: Secondary | ICD-10-CM | POA: Insufficient documentation

## 2014-03-22 DIAGNOSIS — Z3200 Encounter for pregnancy test, result unknown: Secondary | ICD-10-CM | POA: Diagnosis present

## 2014-03-22 LAB — URINALYSIS, ROUTINE W REFLEX MICROSCOPIC
BILIRUBIN URINE: NEGATIVE
Glucose, UA: NEGATIVE mg/dL
Hgb urine dipstick: NEGATIVE
KETONES UR: 15 mg/dL — AB
Leukocytes, UA: NEGATIVE
Nitrite: NEGATIVE
PROTEIN: NEGATIVE mg/dL
SPECIFIC GRAVITY, URINE: 1.025 (ref 1.005–1.030)
UROBILINOGEN UA: 1 mg/dL (ref 0.0–1.0)
pH: 6 (ref 5.0–8.0)

## 2014-03-22 LAB — POCT PREGNANCY, URINE: Preg Test, Ur: POSITIVE — AB

## 2014-03-22 NOTE — MAU Note (Signed)
Patient called for the third time and not in the lobby.

## 2014-03-22 NOTE — MAU Note (Signed)
Pt reports positive home preg test today, states she had an ectopic preg in the past and she just wants to make sure it is in the right spot.

## 2014-03-22 NOTE — MAU Note (Signed)
Not in lobby

## 2014-03-23 ENCOUNTER — Encounter (HOSPITAL_COMMUNITY): Payer: Self-pay | Admitting: *Deleted

## 2014-03-23 ENCOUNTER — Inpatient Hospital Stay (HOSPITAL_COMMUNITY): Payer: Medicaid Other

## 2014-03-23 ENCOUNTER — Inpatient Hospital Stay (HOSPITAL_COMMUNITY)
Admission: AD | Admit: 2014-03-23 | Discharge: 2014-03-23 | Disposition: A | Discharge status: Home / Self Care | Payer: Medicaid Other | Source: Ambulatory Visit | Attending: Obstetrics & Gynecology | Admitting: Obstetrics & Gynecology

## 2014-03-23 DIAGNOSIS — Z87891 Personal history of nicotine dependence: Secondary | ICD-10-CM | POA: Insufficient documentation

## 2014-03-23 DIAGNOSIS — O99891 Other specified diseases and conditions complicating pregnancy: Secondary | ICD-10-CM | POA: Diagnosis not present

## 2014-03-23 DIAGNOSIS — O9989 Other specified diseases and conditions complicating pregnancy, childbirth and the puerperium: Principal | ICD-10-CM

## 2014-03-23 DIAGNOSIS — R109 Unspecified abdominal pain: Secondary | ICD-10-CM | POA: Insufficient documentation

## 2014-03-23 LAB — COMPREHENSIVE METABOLIC PANEL
ALBUMIN: 3.3 g/dL — AB (ref 3.5–5.2)
ALT: 15 U/L (ref 0–35)
AST: 18 U/L (ref 0–37)
Alkaline Phosphatase: 43 U/L (ref 39–117)
Anion gap: 8 (ref 5–15)
BILIRUBIN TOTAL: 0.3 mg/dL (ref 0.3–1.2)
BUN: 7 mg/dL (ref 6–23)
CALCIUM: 8.5 mg/dL (ref 8.4–10.5)
CHLORIDE: 104 meq/L (ref 96–112)
CO2: 27 mEq/L (ref 19–32)
CREATININE: 0.67 mg/dL (ref 0.50–1.10)
GFR calc Af Amer: >90 mL/min (ref >90)
GFR calc non Af Amer: >90 mL/min (ref >90)
Glucose, Bld: 59 mg/dL — ABNORMAL LOW (ref 70–99)
Potassium: 3.3 mEq/L — ABNORMAL LOW (ref 3.7–5.3)
Sodium: 139 mEq/L (ref 137–147)
Total Protein: 6.2 g/dL (ref 6.0–8.3)

## 2014-03-23 LAB — CBC
HCT: 32.6 % — ABNORMAL LOW (ref 36.0–46.0)
Hemoglobin: 10.7 g/dL — ABNORMAL LOW (ref 12.0–15.0)
MCH: 28.3 pg (ref 26.0–34.0)
MCHC: 32.8 g/dL (ref 30.0–36.0)
MCV: 86.2 fL (ref 78.0–100.0)
Platelets: 131 10*3/uL — ABNORMAL LOW (ref 150–400)
RBC: 3.78 MIL/uL — ABNORMAL LOW (ref 3.87–5.11)
RDW: 12.7 % (ref 11.5–15.5)
WBC: 3.7 10*3/uL — AB (ref 4.0–10.5)

## 2014-03-23 LAB — WET PREP, GENITAL
TRICH WET PREP: NONE SEEN
Yeast Wet Prep HPF POC: NONE SEEN

## 2014-03-23 LAB — HCG, QUANTITATIVE, PREGNANCY: hCG, Beta Chain, Quant, S: 1734 m[IU]/mL — ABNORMAL HIGH (ref <5)

## 2014-03-23 NOTE — MAU Note (Signed)
+  HPT yesterday.  Cramping in abd, feels like period is going to start.  Hx of ectopic preg- wants to make sure that is not happening again

## 2014-03-23 NOTE — MAU Provider Note (Signed)
History     CSN: [id number]  Arrival date and time: 03/23/14 1519   First Provider Initiated Contact with Patient 03/23/14 1600      Chief Complaint  Patient presents with  . Abdominal Pain  . Possible Pregnancy   HPI Pt is [id number] with LMP 7/9 and positive UPT yesterday in MAU. Pt was here yesterday but was not having pain; she left before being seen. Pt states she has lower abd discomfort like cramping that comes and goes.  Pt denies vaginal discharge, spotting or bleeding of LOF. Pt denies constipation, diarrhea or UTI sx.  Pt had a normal pregnancy and then ectopic and has had a normal pregnancy since the ectopic. RN note: +HPT yesterday. Cramping in abd, feels like period is going to start. Hx of ectopic preg- wants to make sure that is not happening again  Past Medical History  Diagnosis Date  . Chlamydia   . Gonorrhea   . Anemia     Past Surgical History  Procedure Laterality Date  . Ectopic pregnancy surgery      Family History  Problem Relation Age of Onset  . Asthma Mother   . Depression Mother   . Arthritis Mother   . Hypertension Mother     History  Substance Use Topics  . Smoking status: Former Smoker    Types: Cigarettes  . Smokeless tobacco: Not on file  . Alcohol Use: No     Comment: occas drink    Allergies:  Allergies  Allergen Reactions  . Ultram [Tramadol Hcl] Nausea Only    Prescriptions prior to admission  Medication Sig Dispense Refill  . ibuprofen (ADVIL,MOTRIN) 800 MG tablet Take 800 mg by mouth daily as needed for mild pain.      . Prenatal Vit-Fe Fumarate-FA (PRENATAL MULTIVITAMIN) TABS tablet Take 1 tablet by mouth daily.        Review of Systems  Constitutional: Negative for fever and chills.  Gastrointestinal: Positive for abdominal pain. Negative for nausea, vomiting, diarrhea and constipation.  Genitourinary: Negative for dysuria.   Physical Exam   Blood pressure 113/57, pulse 70, temperature 99.3 F (37.4 C),  temperature source Oral, resp. rate 18, height 5\' 6"  (1.676 m), weight 185 lb (83.915 kg), last menstrual period 02/16/2014.  Physical Exam  Nursing note and vitals reviewed. Constitutional: She is oriented to person, place, and time. She appears well-developed and well-nourished. No distress.  HENT:  Head: Normocephalic.  Eyes: Pupils are equal, round, and reactive to light.  Neck: Normal range of motion. Neck supple.  Cardiovascular: Normal rate.   Respiratory: Effort normal.  GI: Soft. She exhibits no distension. There is tenderness. There is no rebound and no guarding.  Genitourinary:  Normal physiologic vaginal discharge; moist pink vaginal mucosa, cervix clean, closed, NT; uterus NSSC NT; adnexa without palpable enlargement or tenderness  Musculoskeletal: Normal range of motion.  Neurological: She is alert and oriented to person, place, and time.  Skin: Skin is warm and dry.  Psychiatric: She has a normal mood and affect.    MAU Course  Procedures Results for orders placed during the hospital encounter of 03/23/14 (from the past 24 hour(s))  CBC     Status: Abnormal   Collection Time    03/23/14  3:55 PM      Result Value Ref Range   WBC 3.7 (*) 4.0 - 10.5 K/uL   RBC 3.78 (*) 3.87 - 5.11 MIL/uL   Hemoglobin 10.7 (*) 12.0 - 15.0 g/dL  HCT 32.6 (*) 36.0 - 46.0 %   MCV 86.2  78.0 - 100.0 fL   MCH 28.3  26.0 - 34.0 pg   MCHC 32.8  30.0 - 36.0 g/dL   RDW 16.1  09.6 - 04.5 %   Platelets 131 (*) 150 - 400 K/uL  HCG, QUANTITATIVE, PREGNANCY     Status: Abnormal   Collection Time    03/23/14  3:55 PM      Result Value Ref Range   hCG, Beta Chain, Quant, S 1734 (*) <5 mIU/mL  COMPREHENSIVE METABOLIC PANEL     Status: Abnormal   Collection Time    03/23/14  3:55 PM      Result Value Ref Range   Sodium 139  137 - 147 mEq/L   Potassium 3.3 (*) 3.7 - 5.3 mEq/L   Chloride 104  96 - 112 mEq/L   CO2 27  19 - 32 mEq/L   Glucose, Bld 59 (*) 70 - 99 mg/dL   BUN 7  6 - 23 mg/dL    Creatinine, Ser 4.09  0.50 - 1.10 mg/dL   Calcium 8.5  8.4 - 81.1 mg/dL   Total Protein 6.2  6.0 - 8.3 g/dL   Albumin 3.3 (*) 3.5 - 5.2 g/dL   AST 18  0 - 37 U/L   ALT 15  0 - 35 U/L   Alkaline Phosphatase 43  39 - 117 U/L   Total Bilirubin 0.3  0.3 - 1.2 mg/dL   GFR calc non Af Amer >90  >90 mL/min   GFR calc Af Amer >90  >90 mL/min   Anion gap 8  5 - 15  WET PREP, GENITAL     Status: Abnormal   Collection Time    03/23/14  4:10 PM      Result Value Ref Range   Yeast Wet Prep HPF POC NONE SEEN  NONE SEEN   Trich, Wet Prep NONE SEEN  NONE SEEN   Clue Cells Wet Prep HPF POC FEW (*) NONE SEEN   WBC, Wet Prep HPF POC FEW (*) NONE SEEN  US Ob Comp Less 14 Wks  03/23/2014   CLINICAL DATA:  Pain, cramping.  EXAM: OBSTETRIC <14 WK [place] AND TRANSVAGINAL OB US  TECHNIQUE: Both transabdominal and transvaginal ultrasound examinations were performed for complete evaluation of the gestation as well as the maternal uterus, adnexal regions, and pelvic cul-de-sac. Transvaginal technique was performed to assess early pregnancy.  COMPARISON:  None.  FINDINGS: Intrauterine gestational sac: Possible intrauterine gestational measuring 4.5 mm  Yolk sac:  Not present  Embryo:  Not present  Cardiac Activity: Not present  Maternal uterus/adnexae: The right ovary measures 5.0 x 4.8 x 3.5 cm. There is a 3.3 x 3.3 x 3.2 cm right ovarian cyst.  The left ovary measures 4.1 x 2.2 x 2.6 cm. Prominent left ovarian follicle versus corpus luteum.  IMPRESSION: Probable early intrauterine gestational sac, but no yolk sac, fetal pole, or cardiac activity yet visualized. Recommend follow-up quantitative B-HCG levels and follow-up US in 14 days to confirm and assess viability. This recommendation follows SRU consensus guidelines: Diagnostic Criteria for Nonviable Pregnancy Early in the First Trimester. Malva Limes Med 2013; 914:7829-56.  The right ovary is enlarged and contains a simple cyst. Given the cyst and enlargement of the right  ovary, if pain localizes to this location, intermittent torsion would not be excluded.  These results will be called to the ordering clinician or representative by the Radiologist Assistant,  and communication documented in the PACS or zVision Dashboard.   Electronically Signed   By: Annia Belt M.D.   On: 03/23/2014 17:23   US Ob Transvaginal  03/23/2014   CLINICAL DATA:  Pain, cramping.  EXAM: OBSTETRIC <14 WK [place] AND TRANSVAGINAL OB US  TECHNIQUE: Both transabdominal and transvaginal ultrasound examinations were performed for complete evaluation of the gestation as well as the maternal uterus, adnexal regions, and pelvic cul-de-sac. Transvaginal technique was performed to assess early pregnancy.  COMPARISON:  None.  FINDINGS: Intrauterine gestational sac: Possible intrauterine gestational measuring 4.5 mm  Yolk sac:  Not present  Embryo:  Not present  Cardiac Activity: Not present  Maternal uterus/adnexae: The right ovary measures 5.0 x 4.8 x 3.5 cm. There is a 3.3 x 3.3 x 3.2 cm right ovarian cyst.  The left ovary measures 4.1 x 2.2 x 2.6 cm. Prominent left ovarian follicle versus corpus luteum.  IMPRESSION: Probable early intrauterine gestational sac, but no yolk sac, fetal pole, or cardiac activity yet visualized. Recommend follow-up quantitative B-HCG levels and follow-up US in 14 days to confirm and assess viability. This recommendation follows SRU consensus guidelines: Diagnostic Criteria for Nonviable Pregnancy Early in the First Trimester. Malva Limes Med 2013; 409:8119-14.  The right ovary is enlarged and contains a simple cyst. Given the cyst and enlargement of the right ovary, if pain localizes to this location, intermittent torsion would not be excluded.  These results will be called to the ordering clinician or representative by the Radiologist Assistant, and communication documented in the PACS or zVision Dashboard.   Electronically Signed   By: Annia Belt M.D.   On: 03/23/2014 17:23    Assessment  and Plan  abd pain in pregnancy Hx of ruptured ectopic  F/u in 48 hours for repeat Beta HCG Return if increase pain or bleeding  Jerilyn Gillaspie 03/23/2014, 4:24 PM

## 2014-03-24 LAB — GC/CHLAMYDIA PROBE AMP
CT Probe RNA: NEGATIVE
GC Probe RNA: NEGATIVE

## 2014-03-24 NOTE — MAU Provider Note (Signed)

## 2014-03-25 ENCOUNTER — Encounter (HOSPITAL_COMMUNITY): Payer: Self-pay

## 2014-03-25 ENCOUNTER — Inpatient Hospital Stay (HOSPITAL_COMMUNITY)
Admission: AD | Admit: 2014-03-25 | Discharge: 2014-03-25 | Disposition: A | Discharge status: Home / Self Care | Payer: Medicaid Other | Source: Ambulatory Visit | Attending: Obstetrics and Gynecology | Admitting: Obstetrics and Gynecology

## 2014-03-25 DIAGNOSIS — R109 Unspecified abdominal pain: Secondary | ICD-10-CM | POA: Insufficient documentation

## 2014-03-25 DIAGNOSIS — O99891 Other specified diseases and conditions complicating pregnancy: Secondary | ICD-10-CM | POA: Diagnosis present

## 2014-03-25 DIAGNOSIS — O26899 Other specified pregnancy related conditions, unspecified trimester: Secondary | ICD-10-CM

## 2014-03-25 DIAGNOSIS — O9989 Other specified diseases and conditions complicating pregnancy, childbirth and the puerperium: Principal | ICD-10-CM

## 2014-03-25 LAB — HCG, QUANTITATIVE, PREGNANCY: HCG, BETA CHAIN, QUANT, S: 3715 m[IU]/mL — AB (ref <5)

## 2014-03-25 NOTE — MAU Note (Signed)
Pt states here for repeat BHCG. Denies pain or bleeding. 

## 2014-03-30 ENCOUNTER — Other Ambulatory Visit (HOSPITAL_COMMUNITY): Payer: Self-pay | Admitting: Gynecology

## 2014-03-30 DIAGNOSIS — O3680X1 Pregnancy with inconclusive fetal viability, fetus 1: Secondary | ICD-10-CM

## 2014-03-31 ENCOUNTER — Ambulatory Visit (HOSPITAL_COMMUNITY)
Admission: RE | Admit: 2014-03-31 | Discharge: 2014-03-31 | Disposition: A | Discharge status: Home / Self Care | Payer: Medicaid Other | Source: Ambulatory Visit | Attending: Gynecology | Admitting: Gynecology

## 2014-03-31 DIAGNOSIS — O3680X1 Pregnancy with inconclusive fetal viability, fetus 1: Secondary | ICD-10-CM

## 2014-03-31 DIAGNOSIS — N83209 Unspecified ovarian cyst, unspecified side: Secondary | ICD-10-CM | POA: Diagnosis not present

## 2014-03-31 DIAGNOSIS — Z3689 Encounter for other specified antenatal screening: Secondary | ICD-10-CM | POA: Diagnosis present

## 2014-03-31 DIAGNOSIS — O34599 Maternal care for other abnormalities of gravid uterus, unspecified trimester: Secondary | ICD-10-CM | POA: Diagnosis not present

## 2014-04-09 ENCOUNTER — Encounter (HOSPITAL_COMMUNITY): Payer: Self-pay | Admitting: *Deleted

## 2014-04-09 ENCOUNTER — Inpatient Hospital Stay (HOSPITAL_COMMUNITY)
Admission: AD | Admit: 2014-04-09 | Discharge: 2014-04-09 | Disposition: A | Discharge status: Home / Self Care | Payer: Medicaid Other | Source: Ambulatory Visit | Attending: Obstetrics & Gynecology | Admitting: Obstetrics & Gynecology

## 2014-04-09 DIAGNOSIS — R42 Dizziness and giddiness: Secondary | ICD-10-CM | POA: Diagnosis present

## 2014-04-09 DIAGNOSIS — O219 Vomiting of pregnancy, unspecified: Secondary | ICD-10-CM

## 2014-04-09 DIAGNOSIS — Z87891 Personal history of nicotine dependence: Secondary | ICD-10-CM | POA: Insufficient documentation

## 2014-04-09 DIAGNOSIS — O210 Mild hyperemesis gravidarum: Secondary | ICD-10-CM | POA: Insufficient documentation

## 2014-04-09 LAB — CBC
HCT: 34.6 % — ABNORMAL LOW (ref 36.0–46.0)
Hemoglobin: 11.6 g/dL — ABNORMAL LOW (ref 12.0–15.0)
MCH: 28.6 pg (ref 26.0–34.0)
MCHC: 33.5 g/dL (ref 30.0–36.0)
MCV: 85.4 fL (ref 78.0–100.0)
Platelets: 152 10*3/uL (ref 150–400)
RBC: 4.05 MIL/uL (ref 3.87–5.11)
RDW: 12.3 % (ref 11.5–15.5)
WBC: 4.9 10*3/uL (ref 4.0–10.5)

## 2014-04-09 LAB — URINE MICROSCOPIC-ADD ON

## 2014-04-09 LAB — URINALYSIS, ROUTINE W REFLEX MICROSCOPIC
Bilirubin Urine: NEGATIVE
Glucose, UA: NEGATIVE mg/dL
Ketones, ur: 15 mg/dL — AB
NITRITE: NEGATIVE
PH: 6 (ref 5.0–8.0)
Protein, ur: NEGATIVE mg/dL
SPECIFIC GRAVITY, URINE: 1.02 (ref 1.005–1.030)
Urobilinogen, UA: 1 mg/dL (ref 0.0–1.0)

## 2014-04-09 LAB — COMPREHENSIVE METABOLIC PANEL
ALK PHOS: 45 U/L (ref 39–117)
ALT: 15 U/L (ref 0–35)
AST: 15 U/L (ref 0–37)
Albumin: 3.3 g/dL — ABNORMAL LOW (ref 3.5–5.2)
Anion gap: 8 (ref 5–15)
BUN: 8 mg/dL (ref 6–23)
CO2: 27 meq/L (ref 19–32)
Calcium: 8.7 mg/dL (ref 8.4–10.5)
Chloride: 101 mEq/L (ref 96–112)
Creatinine, Ser: 0.66 mg/dL (ref 0.50–1.10)
Glucose, Bld: 85 mg/dL (ref 70–99)
POTASSIUM: 3.9 meq/L (ref 3.7–5.3)
SODIUM: 136 meq/L — AB (ref 137–147)
TOTAL PROTEIN: 6.6 g/dL (ref 6.0–8.3)
Total Bilirubin: 0.5 mg/dL (ref 0.3–1.2)

## 2014-04-09 MED ORDER — METOCLOPRAMIDE HCL 10 MG PO TABS
10.0000 mg | ORAL_TABLET | Freq: Once | ORAL | Status: AC
  Administered 2014-04-09: 10 mg via ORAL
  Filled 2014-04-09: qty 1

## 2014-04-09 MED ORDER — METOCLOPRAMIDE HCL 10 MG PO TABS: 10.0000 mg | ORAL_TABLET | Freq: Three times a day (TID) | ORAL | Status: DC

## 2014-04-09 NOTE — MAU Note (Signed)
C/o weakness and dizziness for past 2-3 days; about & weeks gestation;

## 2014-04-09 NOTE — MAU Provider Note (Signed)
, Chief Complaint: Dizziness   First Provider Initiated Contact with Patient 04/09/14 1348      SUBJECTIVE HPI: Samantha Lewis is a 28 y.o. [id number] at [redacted]w[redacted]d by LMP who presents with dizziness x a few days and N/V. Vomited 2-3 times in past 24 hours. Hasn't tried anything for Sx. Able to keep down some food and fluids.  Live SIUP verified at last MAU visit. Denies abd pain, VB.   Past Medical History  Diagnosis Date  . Chlamydia   . Gonorrhea   . Anemia    OB History  Gravida Para Term Preterm AB SAB TAB Ectopic Multiple Living  # Outcome Date GA Lbr Len/2nd Weight Sex Delivery Anes PTL Lv  5 CUR           4 TAB           3 ECT           2 TRM      SVD   Y  1 TRM      SVD   Y     Past Surgical History  Procedure Laterality Date  . Ectopic pregnancy surgery     History   Social History  . Marital Status: Married    Spouse Name: N/A    Number of Children: N/A  . Years of Education: N/A   Occupational History  . Not on file.   Social History Main Topics  . Smoking status: Former Smoker    Types: Cigarettes  . Smokeless tobacco: Not on file  . Alcohol Use: No     Comment: occas drink  . Drug Use: No  . Sexual Activity: Yes    Birth Control/ Protection: None   Other Topics Concern  . Not on file   Social History Narrative  . No narrative on file   No current facility-administered medications on file prior to encounter.   Current Outpatient Prescriptions on File Prior to Encounter  Medication Sig Dispense Refill  . Prenatal Vit-Fe Fumarate-FA (PRENATAL MULTIVITAMIN) TABS tablet Take 1 tablet by mouth daily.       Allergies  Allergen Reactions  . Ultram [Tramadol Hcl] Nausea Only    ROS: Pertinent pos items in HPI. Neg for fever, chills, diarrhea, constipation, sick contacts, HA, difficulties w/ speech or gait, chest pain, SOB, palpitations.   OBJECTIVE Blood pressure 103/60, pulse 64, temperature 98.8 F (37.1 C), temperature  source Oral, resp. rate 18, height  (1.676 m), weight 83.235 kg (183 lb 8 oz), last menstrual period 02/16/2014. GENERAL: Well-developed, well-nourished female in no acute distress.  HEENT: Normocephalic HEART: normal rate and rhythm. No M/R/G RESP: normal effort NEURO: Alert and oriented. Normal speech and gait.  SPECULUM EXAM: Deferred  LAB RESULTS Results for orders placed during the hospital encounter of 04/11/14 (from the past 168 hour(s))  WET PREP, GENITAL   Collection Time    04/11/14  1:53 PM      Result Value Ref Range   Yeast Wet Prep HPF POC NONE SEEN  NONE SEEN   Trich, Wet Prep NONE SEEN  NONE SEEN   Clue Cells Wet Prep HPF POC MODERATE (*) NONE SEEN   WBC, Wet Prep HPF POC MODERATE (*) NONE SEEN  Results for orders placed during the hospital encounter of 04/09/14 (from the past 168 hour(s))  URINALYSIS, ROUTINE W REFLEX MICROSCOPIC   Collection Time    04/09/14  1:20 PM      Result Value Ref Range   Color, Urine YELLOW  YELLOW   APPearance CLEAR  CLEAR   Specific Gravity, Urine 1.020  1.005 - 1.030   pH 6.0  5.0 - 8.0   Glucose, UA NEGATIVE  NEGATIVE mg/dL   Hgb urine dipstick TRACE (*) NEGATIVE   Bilirubin Urine NEGATIVE  NEGATIVE   Ketones, ur 15 (*) NEGATIVE mg/dL   Protein, ur NEGATIVE  NEGATIVE mg/dL   Urobilinogen, UA 1.0  0.0 - 1.0 mg/dL   Nitrite NEGATIVE  NEGATIVE   Leukocytes, UA TRACE (*) NEGATIVE  URINE MICROSCOPIC-ADD ON   Collection Time    04/09/14  1:20 PM      Result Value Ref Range   Squamous Epithelial / LPF MANY (*) RARE   WBC, UA 0-2  <3 WBC/hpf   RBC / HPF 0-2  <3 RBC/hpf   Urine-Other MUCOUS PRESENT    CBC   Collection Time    04/09/14  1:40 PM      Result Value Ref Range   WBC 4.9  4.0 - 10.5 K/uL   RBC 4.05  3.87 - 5.11 MIL/uL   Hemoglobin 11.6 (*) 12.0 - 15.0 g/dL   HCT 16.1 (*) 09.6 - 04.5 %   MCV 85.4  78.0 - 100.0 fL   MCH 28.6  26.0 - 34.0 pg   MCHC 33.5  30.0 - 36.0 g/dL   RDW 40.9  81.1 - 91.4 %   Platelets 152   150 - 400 K/uL  COMPREHENSIVE METABOLIC PANEL   Collection Time    04/09/14  1:40 PM      Result Value Ref Range   Sodium 136 (*) 137 - 147 mEq/L   Potassium 3.9  3.7 - 5.3 mEq/L   Chloride 101  96 - 112 mEq/L   CO2 27  19 - 32 mEq/L   Glucose, Bld 85  70 - 99 mg/dL   BUN 8  6 - 23 mg/dL   Creatinine, Ser 7.82  0.50 - 1.10 mg/dL   Calcium 8.7  8.4 - 95.6 mg/dL   Total Protein 6.6  6.0 - 8.3 g/dL   Albumin 3.3 (*) 3.5 - 5.2 g/dL   AST 15  0 - 37 U/L   ALT 15  0 - 35 U/L   Alkaline Phosphatase 45  39 - 117 U/L   Total Bilirubin 0.5  0.3 - 1.2 mg/dL   GFR calc non Af Amer >90  >90 mL/min   GFR calc Af Amer >90  >90 mL/min   Anion gap 8  5 - 15     IMAGING US Ob Transvaginal  03/31/2014   CLINICAL DATA:  Followup fetal viability  EXAM: TRANSVAGINAL OB ULTRASOUND  TECHNIQUE: Transvaginal ultrasound was performed for complete evaluation of the gestation as well as the maternal uterus, adnexal regions, and pelvic cul-de-sac.  COMPARISON:  03/23/2014  FINDINGS: Intrauterine gestational sac: Visualized/normal in shape.  Yolk sac:  Present  Embryo:  Present  Cardiac Activity: Present  Heart Rate: 91 bpm  CRL:   2.1  mm   5 w 6 d                  [place] EDC: 11/25/2014  Maternal uterus/adnexae:  No subchorionic hemorrhage.  RIGHT ovary measures 5.6 x 4.0 x 4.5 cm and contains a large simple cyst measuring 4.1 x 3.4 x 3.9 cm.  LEFT ovary measures 3.5 x 2.8 x 3.1 cm and  contains a potential corpus luteal cyst 2.9 x 1.7 x 1.8 cm.  Small amount of free pelvic fluid.  No adnexal masses.  IMPRESSION: Single live intrauterine gestational as above.  BILATERAL ovarian cysts, larger on RIGHT.  Small amount of nonspecific free pelvic fluid.   Electronically Signed   By: Ulyses Southward M.D.   On: 03/31/2014 15:10   MAU COURSE CBC, CMET, Reglan  Tolerating POs, feeling much better after Reglan.   ASSESSMENT 1. Nausea and vomiting of pregnancy, antepartum   2. Dizziness    PLAN Discharge home in stable  condition. Small frequent snacks and fluids. Follow-up Information   Follow up with Ob/Gyn. (Start prenatal care)       Follow up with [facility] [place] MATERNITY ADMISSIONS. (As needed in emergencies)    Contact information:   [address] [id number] [place] [address] [phone]       Medication List    STOP taking these medications       ibuprofen 800 MG tablet  Commonly known as:  ADVIL,MOTRIN      TAKE these medications       acetaminophen 500 MG tablet  Commonly known as:  TYLENOL  Take 1,000 mg by mouth daily as needed for mild pain or moderate pain.       [place], CNM 04/09/2014  1:33 PM

## 2014-04-09 NOTE — Discharge Instructions (Signed)
Morning Sickness Morning sickness is when you feel sick to your stomach (nauseous) during pregnancy. This nauseous feeling may or may not come with vomiting. It often occurs in the morning but can be a problem any time of day. Morning sickness is most common during the first trimester, but it may continue throughout pregnancy. While morning sickness is unpleasant, it is usually harmless unless you develop severe and continual vomiting (hyperemesis gravidarum). This condition requires more intense treatment.  CAUSES  The cause of morning sickness is not completely known but seems to be related to normal hormonal changes that occur in pregnancy. RISK FACTORS You are at greater risk if you:  Experienced nausea or vomiting before your pregnancy.  Had morning sickness during a previous pregnancy.  Are pregnant with more than one baby, such as twins. TREATMENT  Do not use any medicines (prescription, over-the-counter, or herbal) for morning sickness without first talking to your health care provider. Your health care provider may prescribe or recommend:  Vitamin B6 supplements.  Anti-nausea medicines.  The herbal medicine ginger. HOME CARE INSTRUCTIONS   Only take over-the-counter or prescription medicines as directed by your health care provider.  Taking multivitamins before getting pregnant can prevent or decrease the severity of morning sickness in most women.  Eat a piece of dry toast or unsalted crackers before getting out of bed in the morning.  Eat five or six small meals a day.  Eat dry and bland foods (rice, baked potato). Foods high in carbohydrates are often helpful.  Do not drink liquids with your meals. Drink liquids between meals.  Avoid greasy, fatty, and spicy foods.  Get someone to cook for you if the smell of any food causes nausea and vomiting.  If you feel nauseous after taking prenatal vitamins, take the vitamins at night or with a snack.  Snack on protein  foods (nuts, yogurt, cheese) between meals if you are hungry.  Eat unsweetened gelatins for desserts.  Wearing an acupressure wristband (worn for sea sickness) may be helpful.  Acupuncture may be helpful.  Do not smoke.  Get a humidifier to keep the air in your house free of odors.  Get plenty of fresh air. SEEK MEDICAL CARE IF:   Your home remedies are not working, and you need medicine.  You feel dizzy or lightheaded.  You are losing weight. SEEK IMMEDIATE MEDICAL CARE IF:   You have persistent and uncontrolled nausea and vomiting.  You pass out (faint). MAKE SURE YOU:  Understand these instructions.  Will watch your condition.  Will get help right away if you are not doing well or get worse. Document Released: 09/18/2006 Document Revised: 08/02/2013 Document Reviewed: 01/12/2013 [facility] Patient Information 2015 [place], [place]. This information is not intended to replace advice given to you by your health care provider. Make sure you discuss any questions you have with your health care provider.  Eating Plan for Hyperemesis Gravidarum Severe cases of hyperemesis gravidarum can lead to dehydration and malnutrition. The hyperemesis eating plan is one way to lessen the symptoms of nausea and vomiting. It is often used with prescribed medicines to control your symptoms.  WHAT CAN I DO TO RELIEVE MY SYMPTOMS? Listen to your body. Everyone is different and has different preferences. Find what works best for you. Some of the following things may help:  Eat and drink slowly.  Eat 5-6 small meals daily instead of 3 large meals.   Eat crackers before you get out of bed in the morning.  Starchy foods are usually well tolerated (such as cereal, toast, bread, potatoes, pasta, rice, and pretzels).   Ginger may help with nausea. Add  tsp ground ginger to hot tea or choose ginger tea.   Try drinking 100% fruit juice or an electrolyte drink.  Continue to take your  prenatal vitamins as directed by your health care provider. If you are having trouble taking your prenatal vitamins, talk with your health care provider about different options.  Include at least 1 serving of protein with your meals and snacks (such as meats or poultry, beans, nuts, eggs, or yogurt). Try eating a protein-rich snack before bed (such as cheese and crackers or a half [place] or peanut butter sandwich). WHAT THINGS SHOULD I AVOID TO REDUCE MY SYMPTOMS? The following things may help reduce your symptoms:  Avoid foods with strong smells. Try eating meals in well-ventilated areas that are free of odors.  Avoid drinking water or other beverages with meals. Try not to drink anything less than 30 minutes before and after meals.  Avoid drinking more than 1 cup of fluid at a time.  Avoid fried or high-fat foods, such as butter and cream sauces.  Avoid spicy foods.  Avoid skipping meals the best you can. Nausea can be more intense on an empty stomach. If you cannot tolerate food at that time, do not force it. Try sucking on ice chips or other frozen items and make up the calories later.  Avoid lying down within 2 hours after eating. Document Released: 05/25/2007 Document Revised: 08/02/2013 Document Reviewed: 06/01/2013 [facility] Patient Information 2015 [place], [place]. This information is not intended to replace advice given to you by your health care provider. Make sure you discuss any questions you have with your health care provider.  Prenatal Care [facility]    [facility]  [phone]     Phone: [phone]          [facility]                      [facility]   [place]     Phone: [phone]  Phone: [phone]         [facility]     Phone: [phone]  Phone: [phone]           [facility] [place]                 hone: [phone]  Phone: [phone]         [facility] [address] Ceo      Phone: [phone]  Phone: [phone]         [facility] Dept[facility]                Phone: [phone]  [facility]   [address] [place])          Phone: [phone] [facility]   Phone: [phone]

## 2014-04-11 ENCOUNTER — Encounter (HOSPITAL_COMMUNITY): Payer: Self-pay | Admitting: *Deleted

## 2014-04-11 ENCOUNTER — Inpatient Hospital Stay (HOSPITAL_COMMUNITY)
Admission: AD | Admit: 2014-04-11 | Discharge: 2014-04-11 | Disposition: A | Discharge status: Home / Self Care | Payer: Medicaid Other | Source: Ambulatory Visit | Attending: Family Medicine | Admitting: Family Medicine

## 2014-04-11 DIAGNOSIS — N760 Acute vaginitis: Secondary | ICD-10-CM

## 2014-04-11 DIAGNOSIS — O2390 Unspecified genitourinary tract infection in pregnancy, unspecified trimester: Secondary | ICD-10-CM | POA: Insufficient documentation

## 2014-04-11 DIAGNOSIS — N898 Other specified noninflammatory disorders of vagina: Secondary | ICD-10-CM | POA: Diagnosis present

## 2014-04-11 DIAGNOSIS — Z87891 Personal history of nicotine dependence: Secondary | ICD-10-CM | POA: Insufficient documentation

## 2014-04-11 DIAGNOSIS — B9689 Other specified bacterial agents as the cause of diseases classified elsewhere: Secondary | ICD-10-CM | POA: Diagnosis not present

## 2014-04-11 DIAGNOSIS — A499 Bacterial infection, unspecified: Secondary | ICD-10-CM | POA: Diagnosis not present

## 2014-04-11 LAB — WET PREP, GENITAL
TRICH WET PREP: NONE SEEN
YEAST WET PREP: NONE SEEN

## 2014-04-11 MED ORDER — METRONIDAZOLE 500 MG PO TABS: 500.0000 mg | ORAL_TABLET | Freq: Two times a day (BID) | ORAL | Status: DC

## 2014-04-11 NOTE — MAU Note (Signed)
Started about 4 days ago, started as brownish d/c, now has a fishy odor

## 2014-04-11 NOTE — MAU Provider Note (Signed)
History     CSN: [id number]  Arrival date and time: 04/11/14 1043   First Provider Initiated Contact with Patient 04/11/14 1335      Chief Complaint  Patient presents with  . Vaginal Discharge   HPI Pt is [redacted]w[redacted]d pregnant with a confirmed viable IUP with wet prep and GC Chlamydia done 03/31/2014(normal except for few clue cells)  Pt presents with  Vaginal discharge with odor for about 4 days- pt denies spotting, bleeding or cramping.  Pt had IC about 4 days ago. Pt has not established OB care at this time- has Medicaid and is new to area. Rn note: Started about 4 days ago, started as brownish d/c, now has a fishy odor   Past Medical History  Diagnosis Date  . Chlamydia   . Gonorrhea   . Anemia     Past Surgical History  Procedure Laterality Date  . Ectopic pregnancy surgery      Family History  Problem Relation Age of Onset  . Asthma Mother   . Depression Mother   . Arthritis Mother   . Hypertension Mother     History  Substance Use Topics  . Smoking status: Former Smoker    Types: Cigarettes  . Smokeless tobacco: Not on file  . Alcohol Use: No     Comment: occas drink    Allergies:  Allergies  Allergen Reactions  . Ultram [Tramadol Hcl] Nausea Only    Prescriptions prior to admission  Medication Sig Dispense Refill  . acetaminophen (TYLENOL) 500 MG tablet Take 1,000 mg by mouth daily as needed for mild pain or moderate pain.      Marland Kitchen metoCLOPramide (REGLAN) 10 MG tablet Take 1 tablet (10 mg total) by mouth 3 (three) times daily with meals.  90 tablet  1  . Prenatal Vit-Fe Fumarate-FA (PRENATAL MULTIVITAMIN) TABS tablet Take 1 tablet by mouth daily.        Review of Systems  Constitutional: Negative for fever and chills.  Gastrointestinal: Negative for nausea, vomiting, abdominal pain, diarrhea and constipation.  Genitourinary: Negative for dysuria.   Physical Exam   Blood pressure 105/59, pulse 59, temperature 98.5 F (36.9 C), temperature source  Oral, resp. rate 16, last menstrual period 02/16/2014, SpO2 100.00%.  Physical Exam  Nursing note and vitals reviewed. Constitutional: She is oriented to person, place, and time. She appears well-developed and well-nourished. No distress.  HENT:  Head: Normocephalic.  Eyes: Pupils are equal, round, and reactive to light.  Neck: Normal range of motion. Neck supple.  Cardiovascular: Normal rate.   Respiratory: Effort normal.  GI: Soft.  Genitourinary:  Mod-large amount of frothy white discharge in vault; cervix clean, NT; uterus 7 weeks size NT  Musculoskeletal: Normal range of motion.  Neurological: She is alert and oriented to person, place, and time.  Skin: Skin is warm and dry.  Psychiatric: She has a normal mood and affect.    MAU Course  Procedures Results for orders placed during the hospital encounter of 04/11/14 (from the past 24 hour(s))  WET PREP, GENITAL     Status: Abnormal   Collection Time    04/11/14  1:53 PM      Result Value Ref Range   Yeast Wet Prep HPF POC NONE SEEN  NONE SEEN   Trich, Wet Prep NONE SEEN  NONE SEEN   Clue Cells Wet Prep HPF POC MODERATE (*) NONE SEEN   WBC, Wet Prep HPF POC MODERATE (*) NONE SEEN    Assessment and  Plan  Bacterial vaginosis- flagyl  BID for 7 days F/u with OB care- recommended [facility] 04/11/2014, 1:35 PM

## 2014-04-16 NOTE — MAU Provider Note (Signed)
Attestation of Attending Supervision of Advanced Practitioner (CNM/NP): Evaluation and management procedures were performed by the Advanced Practitioner under my supervision and collaboration. I have reviewed the Advanced Practitioner's note and chart, and I agree with the management and plan.  LEGGETT,KELLY H. 4:26 PM   

## 2014-05-04 ENCOUNTER — Encounter: Payer: Self-pay | Admitting: Obstetrics

## 2014-05-04 ENCOUNTER — Ambulatory Visit (INDEPENDENT_AMBULATORY_CARE_PROVIDER_SITE_OTHER): Payer: Medicaid Other | Admitting: Obstetrics

## 2014-05-04 VITALS — BP 110/66 | HR 74 | Temp 98.3°F | Wt 184.0 lb

## 2014-05-04 DIAGNOSIS — Z3481 Encounter for supervision of other normal pregnancy, first trimester: Secondary | ICD-10-CM

## 2014-05-04 DIAGNOSIS — Z3480 Encounter for supervision of other normal pregnancy, unspecified trimester: Secondary | ICD-10-CM

## 2014-05-04 DIAGNOSIS — O219 Vomiting of pregnancy, unspecified: Secondary | ICD-10-CM

## 2014-05-04 DIAGNOSIS — O210 Mild hyperemesis gravidarum: Secondary | ICD-10-CM

## 2014-05-04 MED ORDER — DOXYLAMINE-PYRIDOXINE 10-10 MG PO TBEC: 2.0000 | DELAYED_RELEASE_TABLET | ORAL | Status: DC

## 2014-05-04 NOTE — Progress Notes (Signed)
  Subjective:    Samantha Lewis is a 28 y.o. female being seen today for her obstetrical visit. She is at [redacted]w[redacted]d gestation. Patient reports: Nausea and vomiting.  Problem List Items Addressed This Visit   None    Visit Diagnoses   Encounter for supervision of other normal pregnancy in first trimester    -  Primary    Relevant Orders       Obstetric panel       HIV antibody       Hemoglobinopathy evaluation       Varicella zoster antibody, IgG       Vit D  25 hydroxy (rtn osteoporosis monitoring)       Culture, OB Urine       Pap IG w/ reflex to HPV when ASC-U       WET PREP BY MOLECULAR PROBE    Nausea and vomiting in pregnancy prior to [redacted] weeks gestation        Relevant Medications       Doxylamine-Pyridoxine 10-10 MG TBEC      There are no active problems to display for this patient.   Objective:     BP 110/66  Pulse 74  Temp(Src) 98.3 F (36.8 C)  Wt 184 lb (83.462 kg)  LMP 02/16/2014 Uterine Size: Below umbilicus     Assessment:    Pregnancy @ [redacted]w[redacted]d  weeks Doing well    Plan:    Problem list reviewed and updated. Labs reviewed.  Follow up in 4 weeks. FIRST/CF mutation testing/NIPT/QUAD SCREEN/fragile X/Ashkenazi Jewish population testing/Spinal muscular atrophy discussed: requested. Role of ultrasound in pregnancy discussed; fetal survey: requested. Amniocentesis discussed: not indicated.

## 2014-05-05 LAB — WET PREP BY MOLECULAR PROBE
Candida species: NEGATIVE
Gardnerella vaginalis: NEGATIVE
TRICHOMONAS VAG: NEGATIVE

## 2014-05-05 LAB — CULTURE, OB URINE
Colony Count: NO GROWTH
Organism ID, Bacteria: NO GROWTH

## 2014-05-08 LAB — PAP IG W/ RFLX HPV ASCU

## 2014-05-11 LAB — POCT URINALYSIS DIPSTICK
Bilirubin, UA: NEGATIVE
Glucose, UA: NEGATIVE
Ketones, UA: NEGATIVE
Leukocytes, UA: NEGATIVE
Nitrite, UA: NEGATIVE
PROTEIN UA: NEGATIVE
RBC UA: NEGATIVE
SPEC GRAV UA: 1.015
UROBILINOGEN UA: NEGATIVE
pH, UA: 6

## 2014-05-11 NOTE — Addendum Note (Signed)
Addended by: Henriette CombsHATTON, ANDREA L on: 05/11/2014 12:04 PM   Modules accepted: Orders

## 2014-05-25 ENCOUNTER — Other Ambulatory Visit: Payer: Self-pay | Admitting: *Deleted

## 2014-05-25 MED ORDER — OB COMPLETE PETITE 35-5-1-200 MG PO CAPS: 1.0000 | ORAL_CAPSULE | Freq: Every day | ORAL | Status: DC

## 2014-05-25 NOTE — Progress Notes (Signed)
Pt called to office requesting Rx for prenatal vitamins.   Return call to pt.  Left message on voicemail making pt aware that a prescription would be sent to her pharmacy.  OB complete petite sent to pharmacy. Pt to contact office if any problems.

## 2014-06-02 ENCOUNTER — Telehealth: Payer: Self-pay | Admitting: *Deleted

## 2014-06-02 ENCOUNTER — Inpatient Hospital Stay (HOSPITAL_COMMUNITY)
Admission: AD | Admit: 2014-06-02 | Discharge: 2014-06-02 | Disposition: A | Discharge status: Home / Self Care | Payer: Medicaid Other | Source: Ambulatory Visit | Attending: Obstetrics & Gynecology | Admitting: Obstetrics & Gynecology

## 2014-06-02 ENCOUNTER — Encounter (HOSPITAL_COMMUNITY): Payer: Self-pay | Admitting: *Deleted

## 2014-06-02 DIAGNOSIS — O4692 Antepartum hemorrhage, unspecified, second trimester: Secondary | ICD-10-CM | POA: Diagnosis present

## 2014-06-02 DIAGNOSIS — Z3A15 15 weeks gestation of pregnancy: Secondary | ICD-10-CM | POA: Insufficient documentation

## 2014-06-02 LAB — WET PREP, GENITAL
Clue Cells Wet Prep HPF POC: NONE SEEN
TRICH WET PREP: NONE SEEN
YEAST WET PREP: NONE SEEN

## 2014-06-02 LAB — OB RESULTS CONSOLE GC/CHLAMYDIA
Chlamydia: NEGATIVE
Gonorrhea: NEGATIVE

## 2014-06-02 LAB — URINALYSIS, ROUTINE W REFLEX MICROSCOPIC
Bilirubin Urine: NEGATIVE
GLUCOSE, UA: NEGATIVE mg/dL
KETONES UR: NEGATIVE mg/dL
Leukocytes, UA: NEGATIVE
Nitrite: NEGATIVE
Protein, ur: NEGATIVE mg/dL
Specific Gravity, Urine: 1.02 (ref 1.005–1.030)
Urobilinogen, UA: 1 mg/dL (ref 0.0–1.0)
pH: 6.5 (ref 5.0–8.0)

## 2014-06-02 LAB — URINE MICROSCOPIC-ADD ON

## 2014-06-02 NOTE — Telephone Encounter (Signed)
Patient called with spotting since 6:00 9:45 LM on VM to CB if light spotting- if heavy and feels emergent needs to go to MAU

## 2014-06-02 NOTE — MAU Note (Signed)
Pain in upper abd, started this morning.  Started spotting this morning.

## 2014-06-02 NOTE — Discharge Instructions (Signed)
Pelvic Rest °Pelvic rest is sometimes recommended for women when:  °· The placenta is partially or completely covering the opening of the cervix (placenta previa). °· There is bleeding between the uterine wall and the amniotic sac in the first trimester (subchorionic hemorrhage). °· The cervix begins to open without labor starting (incompetent cervix, cervical insufficiency). °· The labor is too early (preterm labor). °HOME CARE INSTRUCTIONS °· Do not have sexual intercourse, stimulation, or an orgasm. °· Do not use tampons, douche, or put anything in the vagina. °· Do not lift anything over 10 pounds (4.5 kg). °· Avoid strenuous activity or straining your pelvic muscles. °SEEK MEDICAL CARE IF:  °· You have any vaginal bleeding during pregnancy. Treat this as a potential emergency. °· You have cramping pain felt low in the stomach (stronger than menstrual cramps). °· You notice vaginal discharge (watery, mucus, or bloody). °· You have a low, dull backache. °· There are regular contractions or uterine tightening. °SEEK IMMEDIATE MEDICAL CARE IF: °You have vaginal bleeding and have placenta previa.  °Document Released: 11/22/2010 Document Revised: 10/20/2011 Document Reviewed: 11/22/2010 °[employer]® Patient Information ©2015 [employer]. This information is not intended to replace advice given to you by your health care provider. Make sure you discuss any questions you have with your health care provider. ° °

## 2014-06-02 NOTE — MAU Provider Note (Signed)
Chief Complaint: Abdominal Pain and Vaginal Bleeding   First Provider Initiated Contact with Patient 06/02/14 1101     SUBJECTIVE HPI: Samantha Lewis is a 28 y.o. [id number] at 2471w1d by LMP who presents with onset vaginal spotting at 0600 today. No antecedent intercourse. Did have limited spotting at about 9 wks following intercourse. Denies abdominal pain. No irritative vaginal discharge. No dysuria, hematuria, frequency or urgency of urination.  [facility]@ [facility]: Pap abnl  Past Medical History  Diagnosis Date  . Chlamydia   . Gonorrhea   . Anemia    OB History  Gravida Para Term Preterm AB SAB TAB Ectopic Multiple Living  5 2 2  2  1 1  2     # Outcome Date GA Lbr Len/2nd Weight Sex Delivery Anes PTL Lv  5 CUR           4 TRM 07/11/10     SVD   Y  3 TRM 06/19/04     SVD   Y  2 TAB           1 ECT              Past Surgical History  Procedure Laterality Date  . Ectopic pregnancy surgery     History   Social History  . Marital Status: Married    Spouse Name: N/A    Number of Children: N/A  . Years of Education: N/A   Occupational History  . Not on file.   Social History Main Topics  . Smoking status: Former Smoker    Types: Cigarettes  . Smokeless tobacco: Not on file  . Alcohol Use: No     Comment: occas drink  . Drug Use: No  . Sexual Activity: Yes    Birth Control/ Protection: None   Other Topics Concern  . Not on file   Social History Narrative  . No narrative on file   No current facility-administered medications on file prior to encounter.   Current Outpatient Prescriptions on File Prior to Encounter  Medication Sig Dispense Refill  . acetaminophen (TYLENOL) 500 MG tablet Take 1,000 mg by mouth daily as needed for mild pain or moderate pain.      . Doxylamine-Pyridoxine 10-10 MG TBEC Take 2 tablets by mouth as directed.  60 tablet  5  . Prenatal Vit-Min-FA-Fish Oil ([facility] PRENATAL GUMMY PO) Take 1 tablet by mouth daily.       Allergies  Allergen Reactions   . Ultram [Tramadol Hcl] Nausea Only    ROS: Pertinent items in HPI  OBJECTIVE Blood pressure 117/65, pulse 77, temperature 98.6 F (37 C), temperature source Oral, resp. rate 20, height 5\' 6"  (1.676 m), weight 87.544 kg (193 lb), last menstrual period 02/16/2014. GENERAL: Well-developed, well-nourished female in no acute distress.  HEENT: Normocephalic HEART: normal rate RESP: normal effort ABDOMEN: Soft, non-tender, S=D. DT 157 EXTREMITIES: Nontender, no edema NEURO: Alert and oriented SPECULUM EXAM: NEFG, physiologic discharge, scant brown blood noted, cervix clean with no active bleeding BIMANUAL: cervix L/C, uterus 12-14 wks size, no adnexal tenderness or masses  LAB RESULTS Results for orders placed during the hospital encounter of 06/02/14 (from the past 24 hour(s))  URINALYSIS, ROUTINE W REFLEX MICROSCOPIC     Status: Abnormal   Collection Time    06/02/14 10:05 AM      Result Value Ref Range   Color, Urine YELLOW  YELLOW   APPearance CLEAR  CLEAR   Specific Gravity, Urine 1.020  1.005 -  1.030   pH 6.5  5.0 - 8.0   Glucose, UA NEGATIVE  NEGATIVE mg/dL   Hgb urine dipstick MODERATE (*) NEGATIVE   Bilirubin Urine NEGATIVE  NEGATIVE   Ketones, ur NEGATIVE  NEGATIVE mg/dL   Protein, ur NEGATIVE  NEGATIVE mg/dL   Urobilinogen, UA 1.0  0.0 - 1.0 mg/dL   Nitrite NEGATIVE  NEGATIVE   Leukocytes, UA NEGATIVE  NEGATIVE  URINE MICROSCOPIC-ADD ON     Status: Abnormal   Collection Time    06/02/14 10:05 AM      Result Value Ref Range   Squamous Epithelial / LPF FEW (*) RARE   RBC / HPF 0-2  <3 RBC/hpf   Bacteria, UA FEW (*) RARE  WET PREP, GENITAL     Status: Abnormal   Collection Time    06/02/14 11:25 AM      Result Value Ref Range   Yeast Wet Prep HPF POC NONE SEEN  NONE SEEN   Trich, Wet Prep NONE SEEN  NONE SEEN   Clue Cells Wet Prep HPF POC NONE SEEN  NONE SEEN   WBC, Wet Prep HPF POC MANY (*) NONE SEEN    IMAGING No results found.  MAU COURSE GC/CT  sent  ASSESSMENT 1. Vaginal bleeding in pregnancy, second trimester   [id number] at 1674w1d  PLAN Discharge home with reassurance See AVS on bleeding and pelvic rest   Medication List         acetaminophen 500 MG tablet  Commonly known as:  TYLENOL  Take 1,000 mg by mouth daily as needed for mild pain or moderate pain.     [facility] PRENATAL GUMMY PO  Take 1 tablet by mouth daily.     Doxylamine-Pyridoxine 10-10 MG Tbec  Take 2 tablets by mouth as directed.       Follow-up Information   [facility] (Keep your scheduled appointment)    Contact information:   [address] [place] [id number] [phone]      Danae Orleanseirdre C Mackenize Delgadillo, CNM 06/02/2014  11:03 AM

## 2014-06-03 LAB — GC/CHLAMYDIA PROBE AMP
CT Probe RNA: NEGATIVE
GC Probe RNA: NEGATIVE

## 2014-06-05 ENCOUNTER — Ambulatory Visit (INDEPENDENT_AMBULATORY_CARE_PROVIDER_SITE_OTHER): Payer: Medicaid Other | Admitting: Obstetrics

## 2014-06-05 ENCOUNTER — Encounter: Payer: Self-pay | Admitting: Obstetrics

## 2014-06-05 ENCOUNTER — Encounter: Payer: Medicaid Other | Admitting: Obstetrics

## 2014-06-05 VITALS — BP 120/64 | HR 66 | Temp 98.7°F | Wt 191.0 lb

## 2014-06-05 DIAGNOSIS — Z3482 Encounter for supervision of other normal pregnancy, second trimester: Secondary | ICD-10-CM

## 2014-06-05 DIAGNOSIS — L299 Pruritus, unspecified: Secondary | ICD-10-CM | POA: Insufficient documentation

## 2014-06-05 LAB — POCT URINALYSIS DIPSTICK
Bilirubin, UA: NEGATIVE
Blood, UA: NEGATIVE
GLUCOSE UA: NEGATIVE
KETONES UA: NEGATIVE
Nitrite, UA: NEGATIVE
SPEC GRAV UA: 1.01
Urobilinogen, UA: NEGATIVE
pH, UA: 6.5

## 2014-06-05 LAB — COMPREHENSIVE METABOLIC PANEL
ALK PHOS: 42 U/L (ref 39–117)
ALT: 19 U/L (ref 0–35)
AST: 19 U/L (ref 0–37)
Albumin: 3.7 g/dL (ref 3.5–5.2)
BILIRUBIN TOTAL: 0.4 mg/dL (ref 0.2–1.2)
BUN: 8 mg/dL (ref 6–23)
CO2: 25 mEq/L (ref 19–32)
CREATININE: 0.59 mg/dL (ref 0.50–1.10)
Calcium: 8.7 mg/dL (ref 8.4–10.5)
Chloride: 102 mEq/L (ref 96–112)
Glucose, Bld: 74 mg/dL (ref 70–99)
Potassium: 3.7 mEq/L (ref 3.5–5.3)
Sodium: 136 mEq/L (ref 135–145)
Total Protein: 6.6 g/dL (ref 6.0–8.3)

## 2014-06-05 NOTE — Progress Notes (Signed)
  Subjective:    Samantha Lewis is a 28 y.o. female being seen today for her obstetrical visit. She is at 6938w4d gestation. Patient reports: itching on chest, severe..  Problem List Items Addressed This Visit   None    Visit Diagnoses   Encounter for supervision of other normal pregnancy in second trimester    -  Primary    Relevant Orders       POCT urinalysis dipstick (Completed)       AFP, Quad Screen       US OB Comp + 14 Wk    Itching        Relevant Orders       Bile acids, total       Comprehensive metabolic panel      Patient Active Problem List   Diagnosis Date Noted  . Nausea and vomiting in pregnancy prior to [redacted] weeks gestation 05/04/2014    Objective:     BP 120/64  Pulse 66  Temp(Src) 98.7 F (37.1 C)  Wt 191 lb (86.637 kg)  LMP 02/16/2014 Uterine Size: Below umbilicus     Assessment:    Pregnancy @ 1138w4d  weeks Doing well    Plan:    Problem list reviewed and updated. Labs reviewed.  Follow up in 4 weeks. FIRST/CF mutation testing/NIPT/QUAD SCREEN/fragile X/Ashkenazi Jewish population testing/Spinal muscular atrophy discussed: requested. Role of ultrasound in pregnancy discussed; fetal survey: requested. Amniocentesis discussed: not indicated.

## 2014-06-06 LAB — AFP, QUAD SCREEN
AFP: 23.5 ng/mL
Age Alone: 1:752 {titer}
Curr Gest Age: 15.2 wks.days
HCG TOTAL: 20.13 [IU]/mL
INH: 109.5 pg/mL
Interpretation-AFP: NEGATIVE
MOM FOR HCG: 0.47
MOM FOR INH: 0.67
MoM for AFP: 0.98
OPEN SPINA BIFIDA: NEGATIVE
Tri 18 Scr Risk Est: NEGATIVE
Trisomy 18 (Edward) Syndrome Interp.: 1:18900 {titer}
UE3 MOM: 1.37
uE3 Value: 0.89 ng/mL

## 2014-06-06 LAB — BILE ACIDS, TOTAL: BILE ACIDS TOTAL: 5 umol/L (ref 0–19)

## 2014-06-08 ENCOUNTER — Ambulatory Visit (HOSPITAL_COMMUNITY)
Admission: RE | Admit: 2014-06-08 | Discharge: 2014-06-08 | Disposition: A | Discharge status: Home / Self Care | Payer: Medicaid Other | Source: Ambulatory Visit | Attending: Obstetrics | Admitting: Obstetrics

## 2014-06-08 DIAGNOSIS — Z3A00 Weeks of gestation of pregnancy not specified: Secondary | ICD-10-CM | POA: Insufficient documentation

## 2014-06-08 DIAGNOSIS — Z3482 Encounter for supervision of other normal pregnancy, second trimester: Secondary | ICD-10-CM | POA: Insufficient documentation

## 2014-06-09 NOTE — Telephone Encounter (Signed)
Patient was seen at MAU and evaluated.

## 2014-06-12 ENCOUNTER — Encounter: Payer: Self-pay | Admitting: Obstetrics

## 2014-06-12 DIAGNOSIS — Z1389 Encounter for screening for other disorder: Secondary | ICD-10-CM | POA: Insufficient documentation

## 2014-06-12 DIAGNOSIS — Z3A16 16 weeks gestation of pregnancy: Secondary | ICD-10-CM | POA: Insufficient documentation

## 2014-06-23 ENCOUNTER — Ambulatory Visit (INDEPENDENT_AMBULATORY_CARE_PROVIDER_SITE_OTHER): Payer: Medicaid Other | Admitting: Obstetrics

## 2014-06-23 VITALS — BP 112/71 | HR 61 | Wt 192.0 lb

## 2014-06-23 DIAGNOSIS — O219 Vomiting of pregnancy, unspecified: Secondary | ICD-10-CM

## 2014-06-23 DIAGNOSIS — N758 Other diseases of Bartholin's gland: Secondary | ICD-10-CM

## 2014-06-23 DIAGNOSIS — Z3482 Encounter for supervision of other normal pregnancy, second trimester: Secondary | ICD-10-CM

## 2014-06-23 MED ORDER — DOXYLAMINE-PYRIDOXINE 10-10 MG PO TBEC: 2.0000 | DELAYED_RELEASE_TABLET | ORAL | Status: DC

## 2014-06-23 MED ORDER — CLINDAMYCIN HCL 300 MG PO CAPS: 300.0000 mg | ORAL_CAPSULE | Freq: Three times a day (TID) | ORAL | Status: DC

## 2014-06-24 ENCOUNTER — Encounter: Payer: Self-pay | Admitting: Obstetrics

## 2014-06-24 LAB — WET PREP BY MOLECULAR PROBE
CANDIDA SPECIES: NEGATIVE
GARDNERELLA VAGINALIS: POSITIVE — AB
Trichomonas vaginosis: NEGATIVE

## 2014-06-24 NOTE — Progress Notes (Signed)
Patient ID: Samantha Lewis, female   DOB: 1986-03-09, 28 y.o.   MRN: [id number]  Chief Complaint  Patient presents with  . Routine Prenatal Visit    HPI Samantha Lewis is a 28 y.o. female.  Swollen and tender vulva that has ruptured and drained, and is no longer swollen and tender. HPI  Past Medical History  Diagnosis Date  . Chlamydia   . Gonorrhea   . Anemia     Past Surgical History  Procedure Laterality Date  . Ectopic pregnancy surgery      Family History  Problem Relation Age of Onset  . Asthma Mother   . Depression Mother   . Arthritis Mother   . Hypertension Mother     Social History History  Substance Use Topics  . Smoking status: Former Smoker    Types: Cigarettes  . Smokeless tobacco: Not on file  . Alcohol Use: No     Comment: occas drink    Allergies  Allergen Reactions  . Ultram [Tramadol Hcl] Nausea Only    Current Outpatient Prescriptions  Medication Sig Dispense Refill  . acetaminophen (TYLENOL) 500 MG tablet Take 1,000 mg by mouth daily as needed for mild pain or moderate pain.    . Prenatal Vit-Min-FA-Fish Oil ([facility] PRENATAL GUMMY PO) Take 1 tablet by mouth daily.    . clindamycin (CLEOCIN) 300 MG capsule Take 1 capsule (300 mg total) by mouth 3 (three) times daily. 30 capsule 1  . Doxylamine-Pyridoxine 10-10 MG TBEC Take 2 tablets by mouth as directed. 60 tablet 5   No current facility-administered medications for this visit.    Review of Systems Review of Systems Constitutional: negative for fatigue and weight loss Respiratory: negative for cough and wheezing Cardiovascular: negative for chest pain, fatigue and palpitations Gastrointestinal: negative for abdominal pain and change in bowel habits Genitourinary:negative Integument/breast: negative for nipple discharge Musculoskeletal:negative for myalgias Neurological: negative for gait problems and tremors Behavioral/Psych: negative for abusive relationship, depression Endocrine:  negative for temperature intolerance     Blood pressure 112/71, pulse 61, weight 192 lb (87.091 kg), last menstrual period 02/16/2014.  Physical Exam Physical Exam General:   alert  Skin:   no rash or abnormalities  Lungs:   clear to auscultation bilaterally  Heart:   regular rate and rhythm, S1, S2 normal, no murmur, click, rub or gallop  Breasts:   normal without suspicious masses, skin or nipple changes or axillary nodes  Abdomen:  normal findings: no organomegaly, soft, non-tender and no hernia  Pelvis:  External genitalia: normal general appearance Urinary system: urethral meatus normal and bladder without fullness, nontender Vaginal: normal without tenderness, induration or masses Cervix: normal appearance Adnexa: normal bimanual exam Uterus: anteverted and non-tender, normal size      Data Reviewed labs  Assessment    [redacted] weeks pregnant. Batholin's infection, resolving spontaneously. Nausea and vomiting of pregnancy.    Plan  Clindamycin Rx  Diclegis Rx  F/U 4 weeks.  Orders Placed This Encounter  Procedures  . WET PREP BY MOLECULAR PROBE  . GC/Chlamydia Probe Amp   Meds ordered this encounter  Medications  . clindamycin (CLEOCIN) 300 MG capsule    Sig: Take 1 capsule (300 mg total) by mouth 3 (three) times daily.    Dispense:  30 capsule    Refill:  1  . Doxylamine-Pyridoxine 10-10 MG TBEC    Sig: Take 2 tablets by mouth as directed.    Dispense:  60 tablet    Refill:  5    Take as directed, starting with 2 tablets at bedtime.       Woodie Trusty,[place] A 06/24/2014, 3:46 PM

## 2014-06-26 LAB — GC/CHLAMYDIA PROBE AMP
CT Probe RNA: NEGATIVE
GC Probe RNA: NEGATIVE

## 2014-06-28 ENCOUNTER — Other Ambulatory Visit: Payer: Self-pay | Admitting: Obstetrics

## 2014-07-03 ENCOUNTER — Encounter: Payer: Self-pay | Admitting: Obstetrics

## 2014-07-03 ENCOUNTER — Ambulatory Visit (INDEPENDENT_AMBULATORY_CARE_PROVIDER_SITE_OTHER): Payer: Medicaid Other | Admitting: Obstetrics

## 2014-07-03 VITALS — BP 110/63 | HR 71 | Temp 98.8°F | Wt 194.0 lb

## 2014-07-03 DIAGNOSIS — Z3482 Encounter for supervision of other normal pregnancy, second trimester: Secondary | ICD-10-CM

## 2014-07-03 LAB — POCT URINALYSIS DIPSTICK
Blood, UA: NEGATIVE
GLUCOSE UA: NEGATIVE
Ketones, UA: NEGATIVE
Leukocytes, UA: NEGATIVE
NITRITE UA: NEGATIVE
Protein, UA: NEGATIVE
SPEC GRAV UA: 1.015
pH, UA: 5

## 2014-07-03 NOTE — Progress Notes (Addendum)
Subjective:    Samantha Lewis is a 28 y.o. female being seen today for her obstetrical visit. She is at 618w4d gestation. Patient reports: no complaints . Fetal movement: normal.  Problem List Items Addressed This Visit    None    Visit Diagnoses    Encounter for supervision of other normal pregnancy in second trimester    -  Primary    Relevant Orders       POCT urinalysis dipstick (Completed)      Patient Active Problem List   Diagnosis Date Noted  . Bartholin's gland infection 06/23/2014  . [redacted] weeks gestation of pregnancy   . Encounter for routine screening for malformation using ultrasonics   . Itching 06/05/2014  . Nausea and vomiting in pregnancy prior to [redacted] weeks gestation 05/04/2014   Objective:    BP 110/63 mmHg  Pulse 71  Temp(Src) 98.8 F (37.1 C)  Wt 194 lb (87.998 kg)  LMP 02/16/2014 FHT: 150 BPM  Uterine Size: size equals dates     Assessment:    Pregnancy @ 288w4d    Plan:    OBGCT: ordered for next visit.  Labs, problem list reviewed and updated 2 hr GTT planned Follow up in 4 weeks.

## 2014-07-24 ENCOUNTER — Other Ambulatory Visit: Payer: Self-pay | Admitting: Obstetrics & Gynecology

## 2014-07-31 ENCOUNTER — Encounter: Payer: Medicaid Other | Admitting: Obstetrics

## 2014-07-31 ENCOUNTER — Ambulatory Visit (INDEPENDENT_AMBULATORY_CARE_PROVIDER_SITE_OTHER): Payer: Medicaid Other | Admitting: Obstetrics

## 2014-07-31 VITALS — BP 119/76 | HR 88 | Temp 98.7°F | Wt 204.0 lb

## 2014-07-31 DIAGNOSIS — Z3482 Encounter for supervision of other normal pregnancy, second trimester: Secondary | ICD-10-CM

## 2014-07-31 LAB — POCT URINALYSIS DIPSTICK
Bilirubin, UA: NEGATIVE
Blood, UA: NEGATIVE
Glucose, UA: NEGATIVE
KETONES UA: NEGATIVE
Leukocytes, UA: NEGATIVE
Nitrite, UA: NEGATIVE
PH UA: 7
Protein, UA: NEGATIVE
Urobilinogen, UA: NEGATIVE

## 2014-08-01 ENCOUNTER — Encounter: Payer: Self-pay | Admitting: Obstetrics

## 2014-08-01 NOTE — Progress Notes (Signed)
Subjective:    Samantha Lewis is a 28 y.o. female being seen today for her obstetrical visit. She is at 3642w5d gestation. Patient reports: no complaints . Fetal movement: normal.  Problem List Items Addressed This Visit    None    Visit Diagnoses    Encounter for supervision of other normal pregnancy in second trimester    -  Primary    Relevant Orders       POCT urinalysis dipstick (Completed)      Patient Active Problem List   Diagnosis Date Noted  . Bartholin's gland infection 06/23/2014  . [redacted] weeks gestation of pregnancy   . Encounter for routine screening for malformation using ultrasonics   . Itching 06/05/2014  . Nausea and vomiting in pregnancy prior to [redacted] weeks gestation 05/04/2014   Objective:    BP 119/76 mmHg  Pulse 88  Temp(Src) 98.7 F (37.1 C)  Wt 204 lb (92.534 kg)  LMP 02/16/2014 FHT: 150 BPM  Uterine Size: size equals dates     Assessment:    Pregnancy @ 942w5d    Plan:    OBGCT: ordered.  Labs, problem list reviewed and updated 2 hr GTT planned Follow up in 2 weeks.

## 2014-08-10 ENCOUNTER — Ambulatory Visit: Payer: Medicaid Other | Admitting: Obstetrics

## 2014-08-10 ENCOUNTER — Ambulatory Visit (INDEPENDENT_AMBULATORY_CARE_PROVIDER_SITE_OTHER): Payer: Medicaid Other | Admitting: Obstetrics

## 2014-08-10 VITALS — BP 110/60 | HR 76 | Temp 98.5°F | Wt 208.0 lb

## 2014-08-10 DIAGNOSIS — Z3482 Encounter for supervision of other normal pregnancy, second trimester: Secondary | ICD-10-CM

## 2014-08-10 DIAGNOSIS — K5900 Constipation, unspecified: Secondary | ICD-10-CM

## 2014-08-10 LAB — POCT URINALYSIS DIPSTICK
BILIRUBIN UA: NEGATIVE
Blood, UA: NEGATIVE
GLUCOSE UA: NEGATIVE
Ketones, UA: NEGATIVE
Nitrite, UA: NEGATIVE
Protein, UA: NEGATIVE
SPEC GRAV UA: 1.01
Urobilinogen, UA: NEGATIVE
pH, UA: 6.5

## 2014-08-11 ENCOUNTER — Encounter: Payer: Self-pay | Admitting: Obstetrics

## 2014-08-11 NOTE — L&D Delivery Note (Signed)
This is a 29 year old G 5 P2 who was admitted for IOL for post dates. She progressed normally with one dose of cytotec, IVP pain medications and epidural to the second stage of labor.  She pushed for 5 min.  She delivered a viable infant female, cephalic, over an intact perineum.  A nuchal cord was not identified.  Apgar scores were 9 and 9. Cord double clamped and cut.  Cord blood obtained.  The placenta delivered spontaneously, shultz, with a 3 vessel cord.  Inspection revealed no lacerations. The uterus was firm bleeding stable.  EBL was 251.    Placenta and umbilical artery blood gas were not sent.  There were no complications during the procedure.  Mom and baby skin to skin following delivery. Left in stable condition.    Delivery Note At 9:42 PM a viable female was delivered via Vaginal, Spontaneous Delivery (Presentation: OA).  APGAR: 9, 9; weight  .   Placenta status: spontaneous, intact.  Cord:blood obtained.  Cord pH: N/A  Anesthesia: Epidural  Episiotomy:  none Lacerations:  none Suture Repair: none Est. Blood Loss (mL):  251  Mom to postpartum.  Baby to Couplet care / Skin to Skin.  Mom is breast feeding.    Orvilla CornwallDenney, Leisa Gault A 11/28/2014, 10:15 PM  Orvilla Cornwallachelle Myles Mallicoat, CNM

## 2014-08-11 NOTE — Progress Notes (Signed)
Subjective:    Samantha Lewis is a 29 y.o. female being seen today for her obstetrical visit. She is at [redacted]w[redacted]d gestation. Patient reports: no complaints . Fetal movement: normal.  Problem List Items Addressed This Visit    None    Visit Diagnoses    Constipation, unspecified constipation type    -  Primary      Patient Active Problem List   Diagnosis Date Noted  . Bartholin's gland infection 06/23/2014  . [redacted] weeks gestation of pregnancy   . Encounter for routine screening for malformation using ultrasonics   . Itching 06/05/2014  . Nausea and vomiting in pregnancy prior to [redacted] weeks gestation 05/04/2014   Objective:    BP 110/60 mmHg  Pulse 76  Temp(Src) 98.5 F (36.9 C)  Wt 208 lb (94.348 kg)  LMP 02/16/2014 FHT: 150 BPM  Uterine Size: size equals dates     Assessment:    Pregnancy @ [redacted]w[redacted]d     Constipation  Plan:    MOM and Colace recommended, along with dietary instructions to increase fluids and fiber.  Labs, problem list reviewed and updated  Follow up in 2 weeks.

## 2014-08-15 NOTE — Addendum Note (Signed)
Addended by: Odessa FlemingBOHNE, Denasia Venn M on: 08/15/2014 10:06 AM   Modules accepted: Orders

## 2014-08-21 ENCOUNTER — Ambulatory Visit (INDEPENDENT_AMBULATORY_CARE_PROVIDER_SITE_OTHER): Payer: Medicaid Other | Admitting: Obstetrics

## 2014-08-21 ENCOUNTER — Other Ambulatory Visit: Payer: Medicaid Other

## 2014-08-21 ENCOUNTER — Encounter: Payer: Self-pay | Admitting: Obstetrics

## 2014-08-21 VITALS — BP 114/72 | HR 63 | Temp 97.0°F | Wt 210.0 lb

## 2014-08-21 DIAGNOSIS — Z3483 Encounter for supervision of other normal pregnancy, third trimester: Secondary | ICD-10-CM

## 2014-08-21 DIAGNOSIS — Z3490 Encounter for supervision of normal pregnancy, unspecified, unspecified trimester: Secondary | ICD-10-CM

## 2014-08-21 LAB — POCT URINALYSIS DIPSTICK
BILIRUBIN UA: NEGATIVE
Blood, UA: NEGATIVE
GLUCOSE UA: NEGATIVE
KETONES UA: NEGATIVE
Leukocytes, UA: NEGATIVE
Nitrite, UA: NEGATIVE
PROTEIN UA: NEGATIVE
Spec Grav, UA: 1.015
UROBILINOGEN UA: NEGATIVE
pH, UA: 7

## 2014-08-21 NOTE — Progress Notes (Signed)
Subjective:    Samantha Lewis is a 29 y.o. female being seen today for her obstetrical visit. She is at 2234w4d gestation. Patient reports: no complaints . Fetal movement: normal.  Problem List Items Addressed This Visit    None    Visit Diagnoses    Encounter for supervision of other normal pregnancy in second trimester    -  Primary    Relevant Orders       POCT urinalysis dipstick       Glucose Tolerance, 2 Hours w/1 Hour       CBC       HIV antibody       RPR      Patient Active Problem List   Diagnosis Date Noted  . Bartholin's gland infection 06/23/2014  . [redacted] weeks gestation of pregnancy   . Encounter for routine screening for malformation using ultrasonics   . Itching 06/05/2014  . Nausea and vomiting in pregnancy prior to [redacted] weeks gestation 05/04/2014   Objective:    BP 114/72 mmHg  Pulse 63  Temp(Src) 97 F (36.1 C)  Wt 210 lb (95.255 kg)  LMP 02/16/2014 FHT: 150 BPM  Uterine Size: size equals dates     Assessment:    Pregnancy @ 6734w4d    Plan:    OBGCT: ordered.  Labs, problem list reviewed and updated Follow up in 2 weeks.

## 2014-08-22 LAB — CBC
HCT: 32.6 % — ABNORMAL LOW (ref 36.0–46.0)
HEMOGLOBIN: 10.6 g/dL — AB (ref 12.0–15.0)
MCH: 28.3 pg (ref 26.0–34.0)
MCHC: 32.5 g/dL (ref 30.0–36.0)
MCV: 87.2 fL (ref 78.0–100.0)
MPV: 9.4 fL (ref 8.6–12.4)
Platelets: 188 10*3/uL (ref 150–400)
RBC: 3.74 MIL/uL — AB (ref 3.87–5.11)
RDW: 13.1 % (ref 11.5–15.5)
WBC: 5.4 10*3/uL (ref 4.0–10.5)

## 2014-08-22 LAB — GLUCOSE TOLERANCE, 2 HOURS W/ 1HR
GLUCOSE, 2 HOUR: 59 mg/dL — AB (ref 70–139)
Glucose, 1 hour: 77 mg/dL (ref 70–170)
Glucose, Fasting: 68 mg/dL — ABNORMAL LOW (ref 70–99)

## 2014-08-22 LAB — HIV ANTIBODY (ROUTINE TESTING W REFLEX): HIV 1&2 Ab, 4th Generation: NONREACTIVE

## 2014-08-22 LAB — RPR

## 2014-09-04 ENCOUNTER — Ambulatory Visit (INDEPENDENT_AMBULATORY_CARE_PROVIDER_SITE_OTHER): Payer: Medicaid Other | Admitting: Obstetrics

## 2014-09-04 VITALS — BP 107/67 | HR 66 | Temp 98.9°F | Wt 210.0 lb

## 2014-09-04 DIAGNOSIS — Z3483 Encounter for supervision of other normal pregnancy, third trimester: Secondary | ICD-10-CM

## 2014-09-04 LAB — POCT URINALYSIS DIPSTICK
BILIRUBIN UA: NEGATIVE
Blood, UA: NEGATIVE
Glucose, UA: NEGATIVE
KETONES UA: NEGATIVE
NITRITE UA: NEGATIVE
PH UA: 6
Protein, UA: NEGATIVE
Spec Grav, UA: 1.01
Urobilinogen, UA: NEGATIVE

## 2014-09-05 ENCOUNTER — Encounter: Payer: Self-pay | Admitting: Obstetrics

## 2014-09-05 NOTE — Progress Notes (Signed)
Subjective:    Samantha Lewis is a 29 y.o. female being seen today for her obstetrical visit. She is at 7948w5d gestation. Patient reports no complaints. Fetal movement: normal.  Problem List Items Addressed This Visit    None    Visit Diagnoses    Encounter for supervision of other normal pregnancy in second trimester    -  Primary    Relevant Orders    POCT urinalysis dipstick (Completed)      Patient Active Problem List   Diagnosis Date Noted  . Bartholin's gland infection 06/23/2014  . [redacted] weeks gestation of pregnancy   . Encounter for routine screening for malformation using ultrasonics   . Itching 06/05/2014  . Nausea and vomiting in pregnancy prior to [redacted] weeks gestation 05/04/2014   Objective:    BP 107/67 mmHg  Pulse 66  Temp(Src) 98.9 F (37.2 C)  Wt 210 lb (95.255 kg)  LMP 02/16/2014 FHT:  150 BPM  Uterine Size: size equals dates  Presentation: unsure     Assessment:    Pregnancy @ 4248w5d weeks   Plan:     labs reviewed, problem list updated Consent signed. GBS sent TDAP offered  Rhogam given for RH negative Pediatrician: discussed. Infant feeding: plans to breastfeed. Maternity leave: not discussed. Cigarette smoking: former smoker. Orders Placed This Encounter  Procedures  . POCT urinalysis dipstick   No orders of the defined types were placed in this encounter.   Follow up in 2 Weeks.

## 2014-09-19 ENCOUNTER — Ambulatory Visit (INDEPENDENT_AMBULATORY_CARE_PROVIDER_SITE_OTHER): Payer: Medicaid Other | Admitting: Obstetrics

## 2014-09-19 ENCOUNTER — Encounter: Payer: Self-pay | Admitting: Obstetrics

## 2014-09-19 VITALS — BP 108/70 | HR 82 | Temp 98.7°F | Wt 211.0 lb

## 2014-09-19 DIAGNOSIS — Z23 Encounter for immunization: Secondary | ICD-10-CM

## 2014-09-19 DIAGNOSIS — Z3483 Encounter for supervision of other normal pregnancy, third trimester: Secondary | ICD-10-CM

## 2014-09-19 LAB — POCT URINALYSIS DIPSTICK
Bilirubin, UA: NEGATIVE
Glucose, UA: NEGATIVE
Ketones, UA: NEGATIVE
LEUKOCYTES UA: NEGATIVE
NITRITE UA: NEGATIVE
PH UA: 6
PROTEIN UA: NEGATIVE
RBC UA: NEGATIVE
Spec Grav, UA: 1.015
Urobilinogen, UA: NEGATIVE

## 2014-09-19 MED ORDER — TETANUS-DIPHTH-ACELL PERTUSSIS 5-2.5-18.5 LF-MCG/0.5 IM SUSP
0.5000 mL | Freq: Once | INTRAMUSCULAR | Status: AC
  Administered 2014-09-19: 0.5 mL via INTRAMUSCULAR

## 2014-09-19 MED ORDER — OB COMPLETE PETITE 35-5-1-200 MG PO CAPS: 1.0000 | ORAL_CAPSULE | Freq: Every day | ORAL | Status: AC

## 2014-09-19 NOTE — Progress Notes (Signed)
Subjective:    Samantha Lewis is a 29 y.o. female being seen today for her obstetrical visit. She is at 7742w5d gestation. Patient reports no complaints. Fetal movement: normal.  Problem List Items Addressed This Visit    None    Visit Diagnoses    Encounter for supervision of other normal pregnancy in third trimester    -  Primary    Relevant Medications    Tdap (BOOSTRIX) injection 0.5 mL (Completed)    Other Relevant Orders    POCT urinalysis dipstick (Completed)      Patient Active Problem List   Diagnosis Date Noted  . Bartholin's gland infection 06/23/2014  . [redacted] weeks gestation of pregnancy   . Encounter for routine screening for malformation using ultrasonics   . Itching 06/05/2014  . Nausea and vomiting in pregnancy prior to [redacted] weeks gestation 05/04/2014   Objective:    BP 108/70 mmHg  Pulse 82  Temp(Src) 98.7 F (37.1 C)  Wt 211 lb (95.709 kg)  LMP 02/16/2014 FHT:  150 BPM  Uterine Size: size equals dates  Presentation: unsure     Assessment:    Pregnancy @ 6742w5d weeks   Plan:     labs reviewed, problem list updated Consent signed. GBS sent TDAP offered  Rhogam given for RH negative Pediatrician: discussed. Infant feeding: plans to breastfeed. Maternity leave: not. Cigarette smoking: former smoker. Orders Placed This Encounter  Procedures  . POCT urinalysis dipstick   Meds ordered this encounter  Medications  . Prenat-FeCbn-FeAspGl-FA-Omega (OB COMPLETE PETITE) 35-5-1-200 MG CAPS    Sig: Take 1 capsule by mouth daily.    Dispense:  30 capsule    Refill:  11  . Tdap (BOOSTRIX) injection 0.5 mL    Sig:    Follow up in 2 Weeks.

## 2014-09-26 ENCOUNTER — Encounter (HOSPITAL_COMMUNITY): Payer: Self-pay | Admitting: *Deleted

## 2014-09-26 ENCOUNTER — Inpatient Hospital Stay (HOSPITAL_COMMUNITY)
Admission: AD | Admit: 2014-09-26 | Discharge: 2014-09-26 | Disposition: A | Discharge status: Home / Self Care | Payer: Medicaid Other | Source: Ambulatory Visit | Attending: Obstetrics | Admitting: Obstetrics

## 2014-09-26 DIAGNOSIS — O98813 Other maternal infectious and parasitic diseases complicating pregnancy, third trimester: Secondary | ICD-10-CM | POA: Insufficient documentation

## 2014-09-26 DIAGNOSIS — N949 Unspecified condition associated with female genital organs and menstrual cycle: Secondary | ICD-10-CM | POA: Diagnosis present

## 2014-09-26 DIAGNOSIS — Z3A31 31 weeks gestation of pregnancy: Secondary | ICD-10-CM | POA: Insufficient documentation

## 2014-09-26 DIAGNOSIS — B373 Candidiasis of vulva and vagina: Secondary | ICD-10-CM | POA: Diagnosis not present

## 2014-09-26 DIAGNOSIS — B3731 Acute candidiasis of vulva and vagina: Secondary | ICD-10-CM

## 2014-09-26 DIAGNOSIS — Z87891 Personal history of nicotine dependence: Secondary | ICD-10-CM | POA: Diagnosis not present

## 2014-09-26 DIAGNOSIS — Z3A33 33 weeks gestation of pregnancy: Secondary | ICD-10-CM

## 2014-09-26 LAB — WET PREP, GENITAL
Clue Cells Wet Prep HPF POC: NONE SEEN
Trich, Wet Prep: NONE SEEN

## 2014-09-26 LAB — URINALYSIS, ROUTINE W REFLEX MICROSCOPIC
BILIRUBIN URINE: NEGATIVE
Glucose, UA: NEGATIVE mg/dL
Hgb urine dipstick: NEGATIVE
KETONES UR: NEGATIVE mg/dL
Nitrite: NEGATIVE
Protein, ur: NEGATIVE mg/dL
SPECIFIC GRAVITY, URINE: 1.01 (ref 1.005–1.030)
Urobilinogen, UA: 0.2 mg/dL (ref 0.0–1.0)
pH: 7 (ref 5.0–8.0)

## 2014-09-26 LAB — URINE MICROSCOPIC-ADD ON

## 2014-09-26 MED ORDER — FLUCONAZOLE 150 MG PO TABS: 150.0000 mg | ORAL_TABLET | Freq: Once | ORAL | Status: DC

## 2014-09-26 MED ORDER — FLUCONAZOLE 150 MG PO TABS: ORAL_TABLET | ORAL | Status: DC

## 2014-09-26 NOTE — MAU Provider Note (Signed)
History     CSN: [id number]  Arrival date and time: 09/26/14 1653   First Provider Initiated Contact with Patient 09/26/14 1825      Chief Complaint  Patient presents with  . Vaginal Pain   HPI  Pt is G5P2022 at 31w5 d pregnant c/o of vaginal swelling and itching after intercourse on Sunday. Pt is also having some cramping. Pt has not had any complications with her pregnancy.  Past Medical History  Diagnosis Date  . Chlamydia   . Gonorrhea   . Anemia     Past Surgical History  Procedure Laterality Date  . Ectopic pregnancy surgery      Family History  Problem Relation Age of Onset  . Asthma Mother   . Depression Mother   . Arthritis Mother   . Hypertension Mother     History  Substance Use Topics  . Smoking status: Former Smoker    Types: Cigarettes  . Smokeless tobacco: Not on file  . Alcohol Use: No     Comment: occas drink    Allergies:  Allergies  Allergen Reactions  . Ultram [Tramadol Hcl] Nausea Only    Prescriptions prior to admission  Medication Sig Dispense Refill Last Dose  . acetaminophen (TYLENOL) 500 MG tablet Take 1,000 mg by mouth daily as needed for mild pain or moderate pain.   Taking  . Doxylamine-Pyridoxine 10-10 MG TBEC Take 2 tablets by mouth as directed. (Patient not taking: Reported on 08/21/2014) 60 tablet 5 Not Taking  . metoCLOPramide (REGLAN) 10 MG tablet take 1 tablet by mouth three times a day with MEALS (Patient not taking: Reported on 08/21/2014) 90 tablet 1 Not Taking  . Prenat-FeCbn-FeAspGl-FA-Omega (OB COMPLETE PETITE) 35-5-1-200 MG CAPS Take 1 capsule by mouth daily. 30 capsule 11   . Prenatal Vit-Min-FA-Fish Oil ([facility] PRENATAL GUMMY PO) Take 1 tablet by mouth daily.   Taking    Review of Systems  Constitutional: Negative for fever and chills.  Gastrointestinal: Positive for abdominal pain. Negative for nausea and vomiting.  Genitourinary: Negative for dysuria.  Neurological: Negative for headaches.   Physical Exam    Blood pressure 116/61, pulse 69, temperature 98.1 F (36.7 C), temperature source Oral, resp. rate 18, weight 220 lb (99.791 kg), last menstrual period 02/16/2014.  Physical Exam  Nursing note and vitals reviewed. Constitutional: She appears well-developed and well-nourished. No distress.  HENT:  Head: Normocephalic.  Eyes: Pupils are equal, round, and reactive to light.  Neck: Normal range of motion. Neck supple.  Cardiovascular: Normal rate.   Respiratory: Effort normal.  GI: Soft. She exhibits no distension. There is no tenderness. There is no rebound and no guarding.  Genitourinary:  Vaginal mucosa reddened; mod amount of white frothy discharge in vault; cervix closed, NT  Musculoskeletal: Normal range of motion.  Neurological: She is alert.  Skin: Skin is warm and dry.  Psychiatric: She has a normal mood and affect.    MAU Course  Procedures Wet prep GC/chlamydia Results for orders placed or performed during the hospital encounter of 09/26/14 (from the past 24 hour(s))  Wet prep, genital     Status: Abnormal   Collection Time: 09/26/14  6:21 PM  Result Value Ref Range   Yeast Wet Prep HPF POC FEW (A) NONE SEEN   Trich, Wet Prep NONE SEEN NONE SEEN   Clue Cells Wet Prep HPF POC NONE SEEN NONE SEEN   WBC, Wet Prep HPF POC FEW (A) NONE SEEN  UA pending Diflucan  given  in MAU Assessment and Plan  Yeast vaginitis- Diflucan 150mg  #2 one PO now repeat in 3 days F/u with OB appointment  [facility]oscopyINEBERRY,Lavana Huckeba 09/26/2014, 6:26 PM

## 2014-09-26 NOTE — Discharge Instructions (Signed)

## 2014-09-26 NOTE — MAU Note (Signed)
After she had intercourse, she had pain and noted vaginal swelling. Is still swollen and is itching

## 2014-09-27 LAB — GC/CHLAMYDIA PROBE AMP (~~LOC~~) NOT AT ARMC
Chlamydia: NEGATIVE
Neisseria Gonorrhea: NEGATIVE

## 2014-10-03 ENCOUNTER — Encounter: Payer: Medicaid Other | Admitting: Certified Nurse Midwife

## 2014-10-06 ENCOUNTER — Ambulatory Visit (INDEPENDENT_AMBULATORY_CARE_PROVIDER_SITE_OTHER): Payer: Medicaid Other | Admitting: Certified Nurse Midwife

## 2014-10-06 VITALS — BP 113/69 | HR 80 | Temp 98.7°F | Wt 217.0 lb

## 2014-10-06 DIAGNOSIS — N760 Acute vaginitis: Secondary | ICD-10-CM | POA: Insufficient documentation

## 2014-10-06 DIAGNOSIS — O23593 Infection of other part of genital tract in pregnancy, third trimester: Secondary | ICD-10-CM

## 2014-10-06 DIAGNOSIS — Z3483 Encounter for supervision of other normal pregnancy, third trimester: Secondary | ICD-10-CM

## 2014-10-06 LAB — POCT URINALYSIS DIPSTICK
Bilirubin, UA: NEGATIVE
Glucose, UA: NEGATIVE
Ketones, UA: NEGATIVE
Leukocytes, UA: NEGATIVE
NITRITE UA: NEGATIVE
Protein, UA: NEGATIVE
RBC UA: NEGATIVE
Spec Grav, UA: <=1.005
UROBILINOGEN UA: NEGATIVE
pH, UA: 6

## 2014-10-06 MED ORDER — MICONAZOLE NITRATE 1200 & 2 MG & % VA KIT: 1.0000 | PACK | Freq: Once | VAGINAL | Status: DC

## 2014-10-06 NOTE — Progress Notes (Signed)
Subjective:    Samantha Lewis is a 29 y.o. female being seen today for her obstetrical visit. She is at 73w1dgestation. Patient reports no bleeding, no cramping, no leaking and occasional contractions. Patient complained of painful labial lesion on right side of clitoris several days ago.  Has not felt it recently.  Fetal movement: normal.  Problem List Items Addressed This Visit      Genitourinary   Vulvovaginitis    Other Visit Diagnoses    Vaginitis affecting pregnancy in third trimester, antepartum    -  Primary    Relevant Medications    MONISTAT 1 COMBO PACK 1200-2 MG-% VA KIT    Other Relevant Orders    SureSwab, Vaginosis/Vaginitis Plus      Patient Active Problem List   Diagnosis Date Noted  . Vulvovaginitis 10/06/2014  . Encounter for routine screening for malformation using ultrasonics   . Itching 06/05/2014   Objective:    BP 113/69 mmHg  Pulse 80  Temp(Src) 98.7 F (37.1 C)  Wt 98.431 kg (217 lb)  LMP 02/16/2014 FHT:  135 BPM  Uterine Size: size equals dates  Presentation: cephalic    External vulva: no erythema or lesions seen, vagina: leukorrhea, white, thick, non-puritic.  Patient unable to palpate lesion in question.    Assessment:    Pregnancy @ 39w1deeks   ? Vulvovaginitis vs. Resolved foliculitis  Plan:  Educated patient on proper perineal care.  Encouraged to not shave perineal area.    labs reviewed, problem list updated Pediatrician: discussed. Infant feeding: plans to breastfeed. Maternity leave: not currently employed. Cigarette smoking: never smoked. Encouraged labor/breastfeeding classes at health department.   Orders Placed This Encounter  Procedures  . SureSwab, Vaginosis/Vaginitis Plus   Meds ordered this encounter  Medications  . miconazole (MONISTAT 1 COMBO PACK) kit    Sig: Place 1 each vaginally once.    Dispense:  1 each    Refill:  0   Follow up in 2 Weeks with GBS.

## 2014-10-06 NOTE — Addendum Note (Signed)
Addended by: Henriette CombsHATTON, Andre Gallego L on: 10/06/2014 04:29 PM   Modules accepted: Orders

## 2014-10-10 LAB — SURESWAB, VAGINOSIS/VAGINITIS PLUS
ATOPOBIUM VAGINAE: NOT DETECTED Log (cells/mL)
C. ALBICANS, DNA: DETECTED — AB
C. PARAPSILOSIS, DNA: NOT DETECTED
C. glabrata, DNA: NOT DETECTED
C. trachomatis RNA, TMA: NOT DETECTED
C. tropicalis, DNA: NOT DETECTED
GARDNERELLA VAGINALIS: NOT DETECTED Log (cells/mL)
LACTOBACILLUS SPECIES: NOT DETECTED Log (cells/mL)
MEGASPHAERA SPECIES: 7 Log (cells/mL)
N. gonorrhoeae RNA, TMA: NOT DETECTED
T. vaginalis RNA, QL TMA: NOT DETECTED

## 2014-10-20 ENCOUNTER — Encounter: Payer: Medicaid Other | Admitting: Certified Nurse Midwife

## 2014-10-20 ENCOUNTER — Encounter: Payer: Self-pay | Admitting: Obstetrics

## 2014-10-20 ENCOUNTER — Ambulatory Visit (INDEPENDENT_AMBULATORY_CARE_PROVIDER_SITE_OTHER): Payer: Medicaid Other | Admitting: Obstetrics

## 2014-10-20 VITALS — BP 107/69 | HR 76 | Temp 98.3°F | Wt 222.0 lb

## 2014-10-20 DIAGNOSIS — Z3483 Encounter for supervision of other normal pregnancy, third trimester: Secondary | ICD-10-CM

## 2014-10-20 LAB — POCT URINALYSIS DIPSTICK
BILIRUBIN UA: NEGATIVE
GLUCOSE UA: NEGATIVE
Ketones, UA: NEGATIVE
NITRITE UA: NEGATIVE
PH UA: 7
Protein, UA: NEGATIVE
RBC UA: NEGATIVE
Spec Grav, UA: <=1.005
UROBILINOGEN UA: NEGATIVE

## 2014-10-20 NOTE — Progress Notes (Signed)
Subjective:    Samantha Lewis is a 29 y.o. female being seen today for her obstetrical visit. She is at 826w1d gestation. Patient reports no complaints. Fetal movement: normal.  Problem List Items Addressed This Visit    None    Visit Diagnoses    Encounter for supervision of other normal pregnancy in third trimester    -  Primary    Relevant Orders    Strep B DNA probe    POCT urinalysis dipstick (Completed)      Patient Active Problem List   Diagnosis Date Noted  . Vulvovaginitis 10/06/2014  . Encounter for routine screening for malformation using ultrasonics   . Itching 06/05/2014   Objective:    BP 107/69 mmHg  Pulse 76  Temp(Src) 98.3 F (36.8 C)  Wt 222 lb (100.699 kg)  LMP 02/16/2014 FHT:  150 BPM  Uterine Size: size equals dates  Presentation: unsure     Assessment:    Pregnancy @ 676w1d weeks   Plan:     labs reviewed, problem list updated Consent signed. GBS sent TDAP offered  Rhogam given for RH negative Pediatrician: discussed. Infant feeding: plans to breastfeed. Maternity leave: discussed. Cigarette smoking: former smoker. Orders Placed This Encounter  Procedures  . Strep B DNA probe  . POCT urinalysis dipstick   No orders of the defined types were placed in this encounter.   Follow up in 1 Week.

## 2014-10-22 LAB — STREP B DNA PROBE: STREP GROUP B AG: NOT DETECTED

## 2014-10-23 ENCOUNTER — Encounter: Payer: Medicaid Other | Admitting: Obstetrics

## 2014-10-30 ENCOUNTER — Encounter: Payer: Self-pay | Admitting: Obstetrics

## 2014-10-30 ENCOUNTER — Ambulatory Visit (INDEPENDENT_AMBULATORY_CARE_PROVIDER_SITE_OTHER): Payer: Medicaid Other | Admitting: Obstetrics

## 2014-10-30 VITALS — BP 112/60 | HR 71 | Temp 98.6°F | Wt 222.0 lb

## 2014-10-30 DIAGNOSIS — Z3483 Encounter for supervision of other normal pregnancy, third trimester: Secondary | ICD-10-CM

## 2014-10-30 LAB — POCT URINALYSIS DIPSTICK
Bilirubin, UA: NEGATIVE
Blood, UA: NEGATIVE
Glucose, UA: NEGATIVE
Ketones, UA: NEGATIVE
Leukocytes, UA: NEGATIVE
NITRITE UA: NEGATIVE
PH UA: 6
PROTEIN UA: NEGATIVE
Spec Grav, UA: 1.015
Urobilinogen, UA: NEGATIVE

## 2014-10-30 NOTE — Progress Notes (Signed)
Subjective:    Lestine Mountrin S Aziz is a 29 y.o. female being seen today for her obstetrical visit. She is at 7926w4d gestation. Patient reports decreased FM. Fetal movement: decreased.  Problem List Items Addressed This Visit    None    Visit Diagnoses    Encounter for supervision of other normal pregnancy in third trimester    -  Primary    Relevant Orders    POCT urinalysis dipstick (Completed)      Patient Active Problem List   Diagnosis Date Noted  . Vulvovaginitis 10/06/2014  . Encounter for routine screening for malformation using ultrasonics   . Itching 06/05/2014   Objective:    BP 112/60 mmHg  Pulse 71  Temp(Src) 98.6 F (37 C)  Wt 222 lb (100.699 kg)  LMP 02/16/2014 FHT:  150 BPM  Uterine Size: size equals dates  Presentation: cephalic    NST Reactive  Assessment:    Pregnancy @ 9026w4d weeks    Decreased FM.  Now active FM and reactive NST.  Plan:   Instructions given for kick counts.    labs reviewed, problem list updated Consent signed. GBS sent TDAP offered  Rhogam given for RH negative Pediatrician: discussed. Infant feeding: plans to breastfeed. Maternity leave: discussed. Cigarette smoking: Former smoker. Orders Placed This Encounter  Procedures  . POCT urinalysis dipstick   No orders of the defined types were placed in this encounter.   Follow up in 1 Week.

## 2014-11-07 ENCOUNTER — Ambulatory Visit (INDEPENDENT_AMBULATORY_CARE_PROVIDER_SITE_OTHER): Payer: Medicaid Other | Admitting: Obstetrics

## 2014-11-07 ENCOUNTER — Encounter: Payer: Self-pay | Admitting: Obstetrics

## 2014-11-07 VITALS — BP 106/70 | HR 98 | Temp 98.4°F | Wt 224.0 lb

## 2014-11-07 DIAGNOSIS — Z3483 Encounter for supervision of other normal pregnancy, third trimester: Secondary | ICD-10-CM | POA: Diagnosis not present

## 2014-11-07 LAB — POCT URINALYSIS DIPSTICK
BILIRUBIN UA: NEGATIVE
Blood, UA: NEGATIVE
GLUCOSE UA: NEGATIVE
KETONES UA: NEGATIVE
LEUKOCYTES UA: NEGATIVE
Nitrite, UA: NEGATIVE
PH UA: 6
Spec Grav, UA: 1.02
Urobilinogen, UA: NEGATIVE

## 2014-11-07 NOTE — Progress Notes (Signed)
Subjective:    Samantha Lewis is a 29 y.o. female being seen today for her obstetrical visit. She is at 4655w5d gestation. Patient reports no complaints. Fetal movement: normal.  Problem List Items Addressed This Visit    None    Visit Diagnoses    Encounter for supervision of other normal pregnancy in third trimester    -  Primary    Relevant Orders    POCT urinalysis dipstick (Completed)      Patient Active Problem List   Diagnosis Date Noted  . Vulvovaginitis 10/06/2014  . Encounter for routine screening for malformation using ultrasonics   . Itching 06/05/2014    Objective:    BP 106/70 mmHg  Pulse 98  Temp(Src) 98.4 F (36.9 C)  Wt 224 lb (101.606 kg)  LMP 02/16/2014 FHT: 150 BPM  Uterine Size: size equals dates  Presentations: unsure  Pelvic Exam: Deferred    Assessment:    Pregnancy @ 9155w5d weeks   Plan:   Plans for delivery: Vaginal anticipated; labs reviewed; problem list updated Counseling: Consent signed. Infant feeding: plans to breastfeed. Cigarette smoking: former smoker. L&D discussion: symptoms of labor, discussed when to call, discussed what number to call, anesthetic/analgesic options reviewed and delivering clinician:  plans Physician. Postpartum supports and preparation: circumcision discussed and contraception plans discussed.  Follow up in 1 Week.

## 2014-11-14 ENCOUNTER — Ambulatory Visit (INDEPENDENT_AMBULATORY_CARE_PROVIDER_SITE_OTHER): Payer: Medicaid Other | Admitting: Obstetrics

## 2014-11-14 ENCOUNTER — Encounter: Payer: Self-pay | Admitting: Obstetrics

## 2014-11-14 VITALS — BP 124/79 | HR 96 | Temp 98.3°F | Wt 230.0 lb

## 2014-11-14 DIAGNOSIS — Z3483 Encounter for supervision of other normal pregnancy, third trimester: Secondary | ICD-10-CM | POA: Diagnosis not present

## 2014-11-14 LAB — POCT URINALYSIS DIPSTICK
BILIRUBIN UA: NEGATIVE
Clarity, UA: NEGATIVE
Glucose, UA: NEGATIVE
KETONES UA: NEGATIVE
Leukocytes, UA: NEGATIVE
Nitrite, UA: NEGATIVE
Protein, UA: NEGATIVE
RBC UA: NEGATIVE
Spec Grav, UA: 1.01
Urobilinogen, UA: NEGATIVE
pH, UA: 7.5

## 2014-11-14 NOTE — Progress Notes (Signed)
Subjective:    Samantha Lewis is a 29 y.o. female being seen today for her obstetrical visit. She is at 694w5d gestation. Patient reports no complaints. Fetal movement: normal.  Problem List Items Addressed This Visit    None    Visit Diagnoses    Encounter for supervision of other normal pregnancy in third trimester    -  Primary    Relevant Orders    POCT urinalysis dipstick (Completed)      Patient Active Problem List   Diagnosis Date Noted  . Vulvovaginitis 10/06/2014  . Encounter for routine screening for malformation using ultrasonics   . Itching 06/05/2014    Objective:    BP 124/79 mmHg  Pulse 96  Temp(Src) 98.3 F (36.8 C)  Wt 230 lb (104.327 kg)  LMP 02/16/2014 FHT: 150 BPM  Uterine Size: size equals dates  Presentations: unsure  Pelvic Exam: Deferred    Assessment:    Pregnancy @ 868w5d weeks   Plan:   Plans for delivery: Vaginal anticipated; labs reviewed; problem list updated Counseling: Consent signed. Infant feeding: plans to breastfeed. Cigarette smoking: former smoker. L&D discussion: symptoms of labor, discussed when to call, discussed what number to call, anesthetic/analgesic options reviewed and delivering clinician:  plans Certified Nurse-Midwife. Postpartum supports and preparation: circumcision discussed and contraception plans discussed.  Follow up in 1 Week.

## 2014-11-20 ENCOUNTER — Inpatient Hospital Stay (HOSPITAL_COMMUNITY)
Admission: AD | Admit: 2014-11-20 | Discharge: 2014-11-20 | Disposition: A | Discharge status: Home / Self Care | Payer: Medicaid Other | Source: Ambulatory Visit | Attending: Obstetrics | Admitting: Obstetrics

## 2014-11-20 ENCOUNTER — Encounter (HOSPITAL_COMMUNITY): Payer: Self-pay | Admitting: *Deleted

## 2014-11-20 DIAGNOSIS — B3731 Acute candidiasis of vulva and vagina: Secondary | ICD-10-CM

## 2014-11-20 DIAGNOSIS — B373 Candidiasis of vulva and vagina: Secondary | ICD-10-CM | POA: Diagnosis not present

## 2014-11-20 DIAGNOSIS — Z3A39 39 weeks gestation of pregnancy: Secondary | ICD-10-CM | POA: Diagnosis not present

## 2014-11-20 DIAGNOSIS — Z87891 Personal history of nicotine dependence: Secondary | ICD-10-CM | POA: Insufficient documentation

## 2014-11-20 DIAGNOSIS — O9989 Other specified diseases and conditions complicating pregnancy, childbirth and the puerperium: Secondary | ICD-10-CM | POA: Insufficient documentation

## 2014-11-20 DIAGNOSIS — O26893 Other specified pregnancy related conditions, third trimester: Secondary | ICD-10-CM | POA: Diagnosis not present

## 2014-11-20 DIAGNOSIS — M545 Low back pain: Secondary | ICD-10-CM | POA: Diagnosis present

## 2014-11-20 DIAGNOSIS — M549 Dorsalgia, unspecified: Secondary | ICD-10-CM

## 2014-11-20 DIAGNOSIS — O98813 Other maternal infectious and parasitic diseases complicating pregnancy, third trimester: Secondary | ICD-10-CM | POA: Insufficient documentation

## 2014-11-20 LAB — WET PREP, GENITAL
Clue Cells Wet Prep HPF POC: NONE SEEN
Trich, Wet Prep: NONE SEEN

## 2014-11-20 MED ORDER — TERCONAZOLE 0.4 % VA CREA: 1.0000 | TOPICAL_CREAM | Freq: Every day | VAGINAL | Status: DC

## 2014-11-20 MED ORDER — CYCLOBENZAPRINE HCL 10 MG PO TABS: 10.0000 mg | ORAL_TABLET | Freq: Two times a day (BID) | ORAL | Status: DC | PRN

## 2014-11-20 MED ORDER — CYCLOBENZAPRINE HCL 10 MG PO TABS: 10.0000 mg | ORAL_TABLET | Freq: Two times a day (BID) | ORAL | Status: AC | PRN

## 2014-11-20 NOTE — MAU Note (Signed)
Pt educated about normal discomforts of pregnancy such as back pain and vaginal pressure. Patient states that she has vaginal itching similar to when she has had a yeast infection in the past.

## 2014-11-20 NOTE — MAU Provider Note (Signed)
History     CSN: [id number]  Arrival date and time: 11/20/14 0114   First Provider Initiated Contact with Patient 11/20/14 0145      Chief Complaint  Patient presents with  . Labor Eval   Back Pain This is a new problem. The current episode started in the past 7 days. The problem occurs intermittently. The problem has been gradually worsening since onset. The pain is present in the lumbar spine. The quality of the pain is described as aching and cramping. The pain does not radiate. The pain is moderate. The pain is the same all the time. The symptoms are aggravated by bending and standing. Associated symptoms include abdominal pain (only with contractions). Pertinent negatives include no bowel incontinence, dysuria, fever, headaches, leg pain, numbness, paresis, paresthesias, pelvic pain or weakness. Risk factors include pregnancy. She has tried nothing for the symptoms.  Gynecologic Exam The patient's primary symptoms include genital itching and vaginal discharge. The patient's pertinent negatives include no pelvic pain or vaginal bleeding. This is a new problem. The current episode started yesterday. The problem occurs constantly. The problem has been unchanged. She is pregnant. Associated symptoms include abdominal pain (only with contractions) and back pain. Pertinent negatives include no chills, constipation, diarrhea, dysuria, fever, headaches, nausea or vomiting. The vaginal discharge was white. There has been no bleeding. She has not been passing clots. She has not been passing tissue. She has tried nothing for the symptoms. It is unknown whether or not her partner has an STD.   This is a 29 y.o. female at 4560w4d who presents with c/o Low back pain for several days, which worsened tonight. Thinks it is independent of contractions, which are mild and irregular. Also c/o vaginal itching, similar to yeast infections in past.   RN Note:  Expand All Collapse All   Denies contractions Denies  bright red vaginal bleeding.  Positive fetal movement.  Denies SROM/LOF  Denies any Complications of pregnancy  GBS negative per patient          Pt educated about normal discomforts of pregnancy such as back pain and vaginal pressure. Patient states that she has vaginal itching similar to when she has had a yeast infection in the past.           OB History    Gravida Para Term Preterm AB TAB SAB Ectopic Multiple Living   5 2 2  2 1  1  2       Past Medical History  Diagnosis Date  . Chlamydia   . Gonorrhea   . Anemia     Past Surgical History  Procedure Laterality Date  . Ectopic pregnancy surgery      Family History  Problem Relation Age of Onset  . Asthma Mother   . Depression Mother   . Arthritis Mother   . Hypertension Mother     History  Substance Use Topics  . Smoking status: Former Smoker    Types: Cigarettes  . Smokeless tobacco: Former NeurosurgeonUser  . Alcohol Use: No     Comment: occas drink    Allergies:  Allergies  Allergen Reactions  . Ultram [Tramadol Hcl] Nausea Only    Prescriptions prior to admission  Medication Sig Dispense Refill Last Dose  . acetaminophen (TYLENOL) 500 MG tablet Take 1,000 mg by mouth daily as needed for mild pain or moderate pain.   Past Week at Unknown time  . Prenat-FeCbn-FeAspGl-FA-Omega (OB COMPLETE PETITE) 35-5-1-200 MG CAPS Take 1 capsule by mouth  daily. 30 capsule 11 11/19/2014 at Unknown time  . metoCLOPramide (REGLAN) 10 MG tablet take 1 tablet by mouth three times a day with MEALS 90 tablet 1 More than a month at Unknown time    Review of Systems  Constitutional: Negative for fever, chills and malaise/fatigue.  Gastrointestinal: Positive for abdominal pain (only with contractions). Negative for nausea, vomiting, diarrhea, constipation and bowel incontinence.  Genitourinary: Positive for vaginal discharge. Negative for dysuria and pelvic pain.       Vaginal discharge and itching   Musculoskeletal:  Positive for back pain.  Neurological: Negative for dizziness, weakness, numbness, headaches and paresthesias.   Physical Exam   Blood pressure 125/76, pulse 72, temperature 98 F (36.7 C), temperature source Oral, resp. rate 18, weight 231 lb 6.4 oz (104.962 kg), last menstrual period 02/16/2014.  Physical Exam  Constitutional: She is oriented to person, place, and time. She appears well-developed and well-nourished. No distress.  HENT:  Head: Normocephalic.  Cardiovascular: Normal rate and regular rhythm.   Respiratory: Effort normal. No respiratory distress.  GI: Soft. She exhibits no distension and no mass. There is no tenderness. There is no rebound and no guarding.  Genitourinary: Vaginal discharge (small amount white) found.  No erethema No lesions Dilation: Fingertip Effacement (%): Thick Cervical Position: Posterior Station: Ballotable Exam by:: Judie Petit RN    Musculoskeletal: Normal range of motion.  Neurological: She is alert and oriented to person, place, and time.  Skin: Skin is warm and dry.  Psychiatric: She has a normal mood and affect.  Fetal heart rate reactive Uterine irritability noted, patient not uncomfortable with contractions   MAU Course  Procedures  MDM Results for orders placed or performed during the hospital encounter of 11/20/14 (from the past 24 hour(s))  Wet prep, genital     Status: Abnormal   Collection Time: 11/20/14  1:45 AM  Result Value Ref Range   Yeast Wet Prep HPF POC MODERATE (A) NONE SEEN   Trich, Wet Prep NONE SEEN NONE SEEN   Clue Cells Wet Prep HPF POC NONE SEEN NONE SEEN   WBC, Wet Prep HPF POC MODERATE (A) NONE SEEN     Assessment and Plan  A:  SIUP at [redacted]w[redacted]d       Yeast vaginitis       Low back pain, probably muscle spasm      Prodromal contractions  P:  Discharge home       Rx Terazol 7 provided for yeast vaginitis       Rx Flexeril with instructions for use in back pain       Instructed not to drive while taking  Flexeril       Labor precautions       Followup in in office with Dr Clearance Coots for [employer] care   [facility] 11/20/2014, 1:56 AM

## 2014-11-20 NOTE — Discharge Instructions (Signed)
Back Injury Prevention °Back injuries can be extremely painful and difficult to heal. After having one back injury, you are much more likely to experience another later on. It is important to learn how to avoid injuring or re-injuring your back. The following tips can help you to prevent a back injury. °PHYSICAL FITNESS °· Exercise regularly and try to develop good tone in your abdominal muscles. Your abdominal muscles provide a lot of the support needed by your back. °· Do aerobic exercises (walking, jogging, biking, swimming) regularly. °· Do exercises that increase balance and strength (tai chi, yoga) regularly. This can decrease your risk of falling and injuring your back. °· Stretch before and after exercising. °· Maintain a healthy weight. The more you weigh, the more stress is placed on your back. For every pound of weight, 10 times that amount of pressure is placed on the back. °DIET °· Talk to your caregiver about how much calcium and vitamin D you need per day. These nutrients help to prevent weakening of the bones (osteoporosis). Osteoporosis can cause broken (fractured) bones that lead to back pain. °· Include good sources of calcium in your diet, such as dairy products, green, leafy vegetables, and products with calcium added (fortified). °· Include good sources of vitamin D in your diet, such as milk and foods that are fortified with vitamin D. °· Consider taking a nutritional supplement or a multivitamin if needed. °· Stop smoking if you smoke. °POSTURE °· Sit and stand up straight. Avoid leaning forward when you sit or hunching over when you stand. °· Choose chairs with good low back (lumbar) support. °· If you work at a desk, sit close to your work so you do not need to lean over. Keep your chin tucked in. Keep your neck drawn back and elbows bent at a right angle. Your arms should look like the letter "L." °· Sit high and close to the steering wheel when you drive. Add a lumbar support to your car  seat if needed. °· Avoid sitting or standing in one position for too long. Take breaks to get up, stretch, and walk around at least once every hour. Take breaks if you are driving for long periods of time. °· Sleep on your side with your knees slightly bent, or sleep on your back with a pillow under your knees. Do not sleep on your stomach. °LIFTING, TWISTING, AND REACHING °· Avoid heavy lifting, especially repetitive lifting. If you must do heavy lifting: °· Stretch before lifting. °· Work slowly. °· Rest between lifts. °· Use carts and dollies to move objects when possible. °· Make several small trips instead of carrying 1 heavy load. °· Ask for help when you need it. °· Ask for help when moving big, awkward objects. °· Follow these steps when lifting: °· Stand with your feet shoulder-width apart. °· Get as close to the object as you can. Do not try to pick up heavy objects that are far from your body. °· Use handles or lifting straps if they are available. °· Bend at your knees. Squat down, but keep your heels off the floor. °· Keep your shoulders pulled back, your chin tucked in, and your back straight. °· Lift the object slowly, tightening the muscles in your legs, abdomen, and buttocks. Keep the object as close to the center of your body as possible. °· When you put a load down, use these same guidelines in reverse. °· Do not: °· Lift the object above your waist. °·   Twist at the waist while lifting or carrying a load. Move your feet if you need to turn, not your waist.  Bend over without bending at your knees.  Avoid reaching over your head, across a table, or for an object on a high surface. OTHER TIPS  Avoid wet floors and keep sidewalks clear of ice to prevent falls.  Do not sleep on a mattress that is too soft or too hard.  Keep items that are used frequently within easy reach.  Put heavier objects on shelves at waist level and lighter objects on lower or higher shelves.  Find ways to  decrease your stress, such as exercise, massage, or relaxation techniques. Stress can build up in your muscles. Tense muscles are more vulnerable to injury.  Seek treatment for depression or anxiety if needed. These conditions can increase your risk of developing back pain. SEEK MEDICAL CARE IF:  You injure your back.  You have questions about diet, exercise, or other ways to prevent back injuries. MAKE SURE YOU:  Understand these instructions.  Will watch your condition.  Will get help right away if you are not doing well or get worse. Document Released: 09/04/2004 Document Revised: 10/20/2011 Document Reviewed: 09/08/2011 [facility] Patient Information 2015 [place], [place]. This information is not intended to replace advice given to you by your health care provider. Make sure you discuss any questions you have with your health care provider. Vaginal Delivery During delivery, your health care provider will help you give birth to your baby. During a vaginal delivery, you will work to push the baby out of your vagina. However, before you can push your baby out, a few things need to happen. The opening of your uterus (cervix) has to soften, thin out, and open up (dilate) all the way to 10 cm. Also, your baby has to move down from the uterus into your vagina.  SIGNS OF LABOR  Your health care provider will first need to make sure you are in labor. Signs of labor include:   Passing what is called the mucous plug before labor begins. This is a small amount of blood-stained mucus.  Having regular, painful uterine contractions.   The time between contractions gets shorter.   The discomfort and pain gradually get more intense.  Contraction pains get worse when walking and do not go away when resting.   Your cervix becomes thinner (effacement) and dilates. BEFORE THE DELIVERY Once you are in labor and admitted into the hospital or care center, your health care provider may do the following:    Perform a complete physical exam.  Review any complications related to pregnancy or labor.  Check your blood pressure, pulse, temperature, and heart rate (vital signs).   Determine if, and when, the rupture of amniotic membranes occurred.  Do a vaginal exam (using a sterile glove and lubricant) to determine:   The position (presentation) of the baby. Is the baby's head presenting first (vertex) in the birth canal (vagina), or are the feet or buttocks first (breech)?   The level (station) of the baby's head within the birth canal.   The effacement and dilatation of the cervix.   An electronic fetal monitor is usually placed on your abdomen when you first arrive. This is used to monitor your contractions and the baby's heart rate.  When the monitor is on your abdomen (external fetal monitor), it can only pick up the frequency and length of your contractions. It cannot tell the strength of your contractions.  If it becomes necessary for your health care provider to know exactly how strong your contractions are or to see exactly what the baby's heart rate is doing, an internal monitor may be inserted into your vagina and uterus. Your health care provider will discuss the benefits and risks of using an internal monitor and obtain your permission before inserting the device.  Continuous fetal monitoring may be needed if you have an epidural, are receiving certain medicines (such as oxytocin), or have pregnancy or labor complications.  An IV access tube may be placed into a vein in your arm to deliver fluids and medicines if necessary. THREE STAGES OF LABOR AND DELIVERY Normal labor and delivery is divided into three stages. First Stage This stage starts when you begin to contract regularly and your cervix begins to efface and dilate. It ends when your cervix is completely open (fully dilated). The first stage is the longest stage of labor and can last from 3 hours to 15 hours.    Several methods are available to help with labor pain. You and your health care provider will decide which option is best for you. Options include:   Opioid medicines. These are strong pain medicines that you can get through your IV tube or as a shot into your muscle. These medicines lessen pain but do not make it go away completely.  Epidural. A medicine is given through a thin tube that is inserted in your back. The medicine numbs the lower part of your body and prevents any pain in that area.  Paracervical pain medicine. This is an injection of an anesthetic on each side of your cervix.   You may request natural childbirth, which does not involve the use of pain medicines or an epidural during labor and delivery. Instead, you will use other things, such as breathing exercises, to help cope with the pain. Second Stage The second stage of labor begins when your cervix is fully dilated at 10 cm. It continues until you push your baby down through the birth canal and the baby is born. This stage can take only minutes or several hours.  The location of your baby's head as it moves through the birth canal is reported as a number called a station. If the baby's head has not started its descent, the station is described as being at minus 3 (-3). When your baby's head is at the zero station, it is at the middle of the birth canal and is engaged in the pelvis. The station of your baby helps indicate the progress of the second stage of labor.  When your baby is born, your health care provider may hold the baby with his or her head lowered to prevent amniotic fluid, mucus, and blood from getting into the baby's lungs. The baby's mouth and nose may be suctioned with a small bulb syringe to remove any additional fluid.  Your health care provider may then place the baby on your stomach. It is important to keep the baby from getting cold. To do this, the health care provider will dry the baby off, place the  baby directly on your skin (with no blankets between you and the baby), and cover the baby with warm, dry blankets.   The umbilical cord is cut. Third Stage During the third stage of labor, your health care provider will deliver the placenta (afterbirth) and make sure your bleeding is under control. The delivery of the placenta usually takes about 5 minutes but can take  up to 30 minutes. After the placenta is delivered, a medicine may be given either by IV or injection to help contract the uterus and control bleeding. If you are planning to breastfeed, you can try to do so now. After you deliver the placenta, your uterus should contract and get very firm. If your uterus does not remain firm, your health care provider will massage it. This is important because the contraction of the uterus helps cut off bleeding at the site where the placenta was attached to your uterus. If your uterus does not contract properly and stay firm, you may continue to bleed heavily. If there is a lot of bleeding, medicines may be given to contract the uterus and stop the bleeding.  Document Released: 05/06/2008 Document Revised: 12/12/2013 Document Reviewed: 01/16/2013 [facility] Patient Information 2015 [place], [place]. This information is not intended to replace advice given to you by your health care provider. Make sure you discuss any questions you have with your health care provider. Monilial Vaginitis Vaginitis in a soreness, swelling and redness (inflammation) of the vagina and vulva. Monilial vaginitis is not a sexually transmitted infection. CAUSES  Yeast vaginitis is caused by yeast (candida) that is normally found in your vagina. With a yeast infection, the candida has overgrown in number to a point that upsets the chemical balance. SYMPTOMS   White, thick vaginal discharge.  Swelling, itching, redness and irritation of the vagina and possibly the lips of the vagina (vulva).  Burning or painful  urination.  Painful intercourse. DIAGNOSIS  Things that may contribute to monilial vaginitis are:  Postmenopausal and virginal states.  Pregnancy.  Infections.  Being tired, sick or stressed, especially if you had monilial vaginitis in the past.  Diabetes. Good control will help lower the chance.  Birth control pills.  Tight fitting garments.  Using bubble bath, feminine sprays, douches or deodorant tampons.  Taking certain medications that kill germs (antibiotics).  Sporadic recurrence can occur if you become ill. TREATMENT  Your caregiver will give you medication.  There are several kinds of anti monilial vaginal creams and suppositories specific for monilial vaginitis. For recurrent yeast infections, use a suppository or cream in the vagina 2 times a week, or as directed.  Anti-monilial or steroid cream for the itching or irritation of the vulva may also be used. Get your caregiver's permission.  Painting the vagina with methylene blue solution may help if the monilial cream does not work.  Eating yogurt may help prevent monilial vaginitis. HOME CARE INSTRUCTIONS   Finish all medication as prescribed.  Do not have sex until treatment is completed or after your caregiver tells you it is okay.  Take warm sitz baths.  Do not douche.  Do not use tampons, especially scented ones.  Wear cotton underwear.  Avoid tight pants and panty hose.  Tell your sexual partner that you have a yeast infection. They should go to their caregiver if they have symptoms such as mild rash or itching.  Your sexual partner should be treated as well if your infection is difficult to eliminate.  Practice safer sex. Use condoms.  Some vaginal medications cause latex condoms to fail. Vaginal medications that harm condoms are:  Cleocin cream.  Butoconazole (Femstat).  Terconazole (Terazol) vaginal suppository.  Miconazole (Monistat) (may be purchased over the counter). SEEK  MEDICAL CARE IF:   You have a temperature by mouth above 102 F (38.9 C).  The infection is getting worse after 2 days of treatment.  The infection  is not getting better after 3 days of treatment.  You develop blisters in or around your vagina.  You develop vaginal bleeding, and it is not your menstrual period.  You have pain when you urinate.  You develop intestinal problems.  You have pain with sexual intercourse. Document Released: 05/07/2005 Document Revised: 10/20/2011 Document Reviewed: 01/19/2009 [facility] Patient Information 2015 [place], [place]. This information is not intended to replace advice given to you by your health care provider. Make sure you discuss any questions you have with your health care provider.

## 2014-11-20 NOTE — MAU Note (Signed)
Denies contractions Denies bright red vaginal bleeding.  Positive fetal movement.  Denies SROM/LOF  Denies any Complications of pregnancy  GBS negative per patient

## 2014-11-23 ENCOUNTER — Encounter: Payer: Medicaid Other | Admitting: Obstetrics

## 2014-11-27 ENCOUNTER — Ambulatory Visit (INDEPENDENT_AMBULATORY_CARE_PROVIDER_SITE_OTHER): Payer: Medicaid Other | Admitting: Obstetrics

## 2014-11-27 ENCOUNTER — Inpatient Hospital Stay (HOSPITAL_COMMUNITY)
Admission: AD | Admit: 2014-11-27 | Discharge: 2014-11-27 | Disposition: A | Discharge status: Home / Self Care | Payer: Medicaid Other | Source: Ambulatory Visit | Attending: Obstetrics | Admitting: Obstetrics

## 2014-11-27 ENCOUNTER — Encounter: Payer: Self-pay | Admitting: Obstetrics

## 2014-11-27 ENCOUNTER — Encounter (HOSPITAL_COMMUNITY): Payer: Self-pay | Admitting: *Deleted

## 2014-11-27 ENCOUNTER — Telehealth (HOSPITAL_COMMUNITY): Payer: Self-pay | Admitting: *Deleted

## 2014-11-27 VITALS — BP 116/71 | HR 70 | Temp 97.9°F | Wt 235.0 lb

## 2014-11-27 DIAGNOSIS — Z3A40 40 weeks gestation of pregnancy: Secondary | ICD-10-CM | POA: Diagnosis present

## 2014-11-27 DIAGNOSIS — O480 Post-term pregnancy: Secondary | ICD-10-CM | POA: Diagnosis present

## 2014-11-27 DIAGNOSIS — O99214 Obesity complicating childbirth: Secondary | ICD-10-CM | POA: Diagnosis present

## 2014-11-27 DIAGNOSIS — Z87891 Personal history of nicotine dependence: Secondary | ICD-10-CM

## 2014-11-27 DIAGNOSIS — O368130 Decreased fetal movements, third trimester, not applicable or unspecified: Secondary | ICD-10-CM | POA: Insufficient documentation

## 2014-11-27 DIAGNOSIS — Z3483 Encounter for supervision of other normal pregnancy, third trimester: Secondary | ICD-10-CM | POA: Diagnosis not present

## 2014-11-27 DIAGNOSIS — Z8249 Family history of ischemic heart disease and other diseases of the circulatory system: Secondary | ICD-10-CM

## 2014-11-27 DIAGNOSIS — Z8261 Family history of arthritis: Secondary | ICD-10-CM

## 2014-11-27 DIAGNOSIS — Z6837 Body mass index (BMI) 37.0-37.9, adult: Secondary | ICD-10-CM

## 2014-11-27 LAB — POCT URINALYSIS DIPSTICK
Bilirubin, UA: NEGATIVE
Blood, UA: NEGATIVE
Glucose, UA: NEGATIVE
Ketones, UA: NEGATIVE
Nitrite, UA: NEGATIVE
PROTEIN UA: NEGATIVE
Spec Grav, UA: 1.015
UROBILINOGEN UA: NEGATIVE
pH, UA: 6

## 2014-11-27 NOTE — Telephone Encounter (Signed)
Preadmission screen  

## 2014-11-27 NOTE — Progress Notes (Signed)
Subjective:    Samantha Lewis is a 29 y.o. female being seen today for her obstetrical visit. She is at 3828w4d gestation. Patient reports no complaints. Fetal movement: normal.  Problem List Items Addressed This Visit    None    Visit Diagnoses    Encounter for supervision of other normal pregnancy in third trimester    -  Primary    Relevant Orders    POCT urinalysis dipstick (Completed)    Post-term pregnancy, 40-42 weeks of gestation          Patient Active Problem List   Diagnosis Date Noted  . Vulvovaginitis 10/06/2014  . Encounter for routine screening for malformation using ultrasonics   . Itching 06/05/2014   Objective:    BP 116/71 mmHg  Pulse 70  Temp(Src) 97.9 F (36.6 C)  Wt 235 lb (106.595 kg)  LMP 02/16/2014 FHT:  150 BPM  Uterine Size: size equals dates  Presentation: cephalic     NST:  Reactive  Assessment:    Pregnancy @ 6428w4d weeks   Plan:     labs reviewed, problem list updated Consent signed. GBS sent TDAP offered  Rhogam given for RH negative Pediatrician: discussed. Infant feeding: plans to breastfeed. Maternity leave: discussed. Cigarette smoking: former smoker. Orders Placed This Encounter  Procedures  . POCT urinalysis dipstick   No orders of the defined types were placed in this encounter.   IOL 11-28-18.

## 2014-11-27 NOTE — Patient Instructions (Signed)
Group B Streptococcus Infection During Pregnancy Group B streptococcus (GBS) is a type of bacteria often found in healthy women. GBS is not the same as the bacteria that causes strep throat. You may have GBS in your vagina, rectum, or bladder. GBS does not spread through sexual contact, but it can be passed to a baby during childbirth. This can be dangerous for your baby. It is not dangerous to you and usually does not cause any symptoms. Your health care provider may test you for GBS when your pregnancy is between 35 and 37 weeks. GBS is dangerous only during birth, so there is no need to test for it earlier. It is possible to have GBS during pregnancy and never pass it to your baby. If your test results are positive for GBS, your health care provider may recommend giving you antibiotic medicine during delivery to make sure your baby stays healthy. RISK FACTORS You are more likely to pass GBS to your baby if:   Your water breaks (ruptured membrane) or you go into labor before 37 weeks.  Your water breaks 18 hours before you deliver.  You passed GBS during a previous pregnancy.  You have a urinary tract infection caused by GBS any time during pregnancy.  You have a fever during labor. SYMPTOMS Most women who have GBS do not have any symptoms. If you have a urinary tract infection caused by GBS, you might have frequent or painful urination and fever. Babies who get GBS usually show symptoms within 7 days of birth. Symptoms may include:   Breathing problems.  Heart and blood pressure problems.  Digestive and kidney problems. DIAGNOSIS Routine screening for GBS is recommended for all pregnant women. A health care provider takes a sample of the fluid in your vagina and rectum with a swab. It is then sent to a lab to be checked for GBS. A sample of your urine may also be checked for the bacteria.  TREATMENT If you test positive for GBS, you may need treatment with an antibiotic medicine during  labor. As soon as you go into labor, or as soon as your membranes rupture, you will get the antibiotic medicine through an IV access. You will continue to get the medicine until after you give birth. You do not need antibiotic medicine if you are having a cesarean delivery.If your baby shows signs or symptoms of GBS after birth, your baby can also be treated with an antibiotic medicine. HOME CARE INSTRUCTIONS   Take all antibiotic medicine as prescribed by your health care provider. Only take medicine as directed.   Continue with prenatal visits and care.   Keep all follow-up appointments.  SEEK MEDICAL CARE IF:   You have pain when you urinate.   You have to urinate frequently.   You have a fever.  SEEK IMMEDIATE MEDICAL CARE IF:   Your membranes rupture.  You go into labor. Document Released: 11/04/2007 Document Revised: 08/02/2013 Document Reviewed: 05/20/2013 [employer] Patient Information 2015 [employer]. This information is not intended to replace advice given to you by your health care provider. Make sure you discuss any questions you have with your health care provider.  

## 2014-11-27 NOTE — MAU Note (Signed)
IS AN INDUCTION  AM  AT 0630.   LAST TIME  FELT MOVEMENT- 7PM.     NO VE IN OFFICE. WAS IN MAU-  2 WEEKS AGO- VE    CLOSED.  DENIES HSV AND MRSA. GBS-  UNSURE.

## 2014-11-28 ENCOUNTER — Inpatient Hospital Stay (HOSPITAL_COMMUNITY): Payer: Medicaid Other | Admitting: Anesthesiology

## 2014-11-28 ENCOUNTER — Encounter (HOSPITAL_COMMUNITY): Payer: Self-pay | Admitting: *Deleted

## 2014-11-28 ENCOUNTER — Inpatient Hospital Stay (HOSPITAL_COMMUNITY)
Admission: AD | Admit: 2014-11-28 | Discharge: 2014-11-30 | DRG: 775 | Disposition: A | Discharge status: Home / Self Care | Payer: Medicaid Other | Source: Ambulatory Visit | Attending: Obstetrics | Admitting: Obstetrics

## 2014-11-28 DIAGNOSIS — O480 Post-term pregnancy: Secondary | ICD-10-CM | POA: Diagnosis present

## 2014-11-28 DIAGNOSIS — IMO0001 Reserved for inherently not codable concepts without codable children: Secondary | ICD-10-CM

## 2014-11-28 DIAGNOSIS — Z87891 Personal history of nicotine dependence: Secondary | ICD-10-CM | POA: Diagnosis not present

## 2014-11-28 DIAGNOSIS — Z8261 Family history of arthritis: Secondary | ICD-10-CM | POA: Diagnosis not present

## 2014-11-28 DIAGNOSIS — Z3A40 40 weeks gestation of pregnancy: Secondary | ICD-10-CM | POA: Diagnosis present

## 2014-11-28 DIAGNOSIS — O368130 Decreased fetal movements, third trimester, not applicable or unspecified: Secondary | ICD-10-CM | POA: Diagnosis present

## 2014-11-28 DIAGNOSIS — Z6837 Body mass index (BMI) 37.0-37.9, adult: Secondary | ICD-10-CM | POA: Diagnosis not present

## 2014-11-28 DIAGNOSIS — O99214 Obesity complicating childbirth: Secondary | ICD-10-CM | POA: Diagnosis present

## 2014-11-28 DIAGNOSIS — Z8249 Family history of ischemic heart disease and other diseases of the circulatory system: Secondary | ICD-10-CM | POA: Diagnosis not present

## 2014-11-28 LAB — CBC
HCT: 32 % — ABNORMAL LOW (ref 36.0–46.0)
Hemoglobin: 10.4 g/dL — ABNORMAL LOW (ref 12.0–15.0)
MCH: 27.2 pg (ref 26.0–34.0)
MCHC: 32.5 g/dL (ref 30.0–36.0)
MCV: 83.6 fL (ref 78.0–100.0)
Platelets: 155 10*3/uL (ref 150–400)
RBC: 3.83 MIL/uL — ABNORMAL LOW (ref 3.87–5.11)
RDW: 14.1 % (ref 11.5–15.5)
WBC: 4.8 10*3/uL (ref 4.0–10.5)

## 2014-11-28 LAB — TYPE AND SCREEN
ABO/RH(D): O POS
ANTIBODY SCREEN: NEGATIVE

## 2014-11-28 LAB — RPR: RPR Ser Ql: NONREACTIVE

## 2014-11-28 LAB — HEPATITIS B SURFACE ANTIGEN: Hepatitis B Surface Ag: NEGATIVE

## 2014-11-28 MED ORDER — LIDOCAINE HCL (PF) 1 % IJ SOLN
INTRAMUSCULAR | Status: DC | PRN
  Administered 2014-11-28 (×2): 8 mL

## 2014-11-28 MED ORDER — OXYTOCIN BOLUS FROM INFUSION: 500.0000 mL | INTRAVENOUS | Status: DC

## 2014-11-28 MED ORDER — ONDANSETRON HCL 4 MG/2ML IJ SOLN: 4.0000 mg | Freq: Four times a day (QID) | INTRAMUSCULAR | Status: DC | PRN

## 2014-11-28 MED ORDER — DIPHENHYDRAMINE HCL 50 MG/ML IJ SOLN: 12.5000 mg | INTRAMUSCULAR | Status: DC | PRN

## 2014-11-28 MED ORDER — PHENYLEPHRINE 40 MCG/ML (10ML) SYRINGE FOR IV PUSH (FOR BLOOD PRESSURE SUPPORT)
80.0000 ug | PREFILLED_SYRINGE | INTRAVENOUS | Status: DC | PRN
  Filled 2014-11-28: qty 2
  Filled 2014-11-28: qty 20

## 2014-11-28 MED ORDER — NALBUPHINE HCL 10 MG/ML IJ SOLN
10.0000 mg | INTRAMUSCULAR | Status: DC | PRN
  Administered 2014-11-28: 10 mg via INTRAMUSCULAR
  Filled 2014-11-28: qty 1

## 2014-11-28 MED ORDER — CITRIC ACID-SODIUM CITRATE 334-500 MG/5ML PO SOLN: 30.0000 mL | ORAL | Status: DC | PRN

## 2014-11-28 MED ORDER — LACTATED RINGERS IV SOLN
INTRAVENOUS | Status: DC
  Administered 2014-11-28 (×2): via INTRAVENOUS
  Administered 2014-11-28: 125 mL/h via INTRAVENOUS

## 2014-11-28 MED ORDER — NALBUPHINE HCL 10 MG/ML IJ SOLN
10.0000 mg | INTRAMUSCULAR | Status: DC | PRN
  Administered 2014-11-28: 10 mg via INTRAVENOUS
  Filled 2014-11-28: qty 1

## 2014-11-28 MED ORDER — PHENYLEPHRINE 40 MCG/ML (10ML) SYRINGE FOR IV PUSH (FOR BLOOD PRESSURE SUPPORT)
80.0000 ug | PREFILLED_SYRINGE | INTRAVENOUS | Status: DC | PRN
  Filled 2014-11-28: qty 2

## 2014-11-28 MED ORDER — OXYTOCIN 40 UNITS IN LACTATED RINGERS INFUSION - SIMPLE MED
1.0000 m[IU]/min | INTRAVENOUS | Status: DC
  Administered 2014-11-28: 2 m[IU]/min via INTRAVENOUS

## 2014-11-28 MED ORDER — FENTANYL 2.5 MCG/ML BUPIVACAINE 1/10 % EPIDURAL INFUSION (WH - ANES)
INTRAMUSCULAR | Status: AC
  Filled 2014-11-28: qty 125

## 2014-11-28 MED ORDER — LIDOCAINE HCL (PF) 1 % IJ SOLN
30.0000 mL | INTRAMUSCULAR | Status: DC | PRN
  Filled 2014-11-28: qty 30

## 2014-11-28 MED ORDER — OXYCODONE-ACETAMINOPHEN 5-325 MG PO TABS: 2.0000 | ORAL_TABLET | ORAL | Status: DC | PRN

## 2014-11-28 MED ORDER — MISOPROSTOL 50MCG HALF TABLET
50.0000 ug | ORAL_TABLET | ORAL | Status: DC
  Administered 2014-11-28: 50 ug via ORAL
  Filled 2014-11-28 (×9): qty 1

## 2014-11-28 MED ORDER — PROMETHAZINE HCL 25 MG/ML IJ SOLN
25.0000 mg | Freq: Four times a day (QID) | INTRAMUSCULAR | Status: DC | PRN
  Administered 2014-11-28: 25 mg via INTRAVENOUS
  Filled 2014-11-28: qty 1

## 2014-11-28 MED ORDER — FLEET ENEMA 7-19 GM/118ML RE ENEM: 1.0000 | ENEMA | RECTAL | Status: DC | PRN

## 2014-11-28 MED ORDER — OXYCODONE-ACETAMINOPHEN 5-325 MG PO TABS: 1.0000 | ORAL_TABLET | ORAL | Status: DC | PRN

## 2014-11-28 MED ORDER — ACETAMINOPHEN 325 MG PO TABS: 650.0000 mg | ORAL_TABLET | ORAL | Status: DC | PRN

## 2014-11-28 MED ORDER — FENTANYL 2.5 MCG/ML BUPIVACAINE 1/10 % EPIDURAL INFUSION (WH - ANES)
14.0000 mL/h | INTRAMUSCULAR | Status: DC | PRN
  Administered 2014-11-28: 14 mL/h via EPIDURAL

## 2014-11-28 MED ORDER — LACTATED RINGERS IV SOLN
500.0000 mL | Freq: Once | INTRAVENOUS | Status: AC
  Administered 2014-11-28: 1000 mL via INTRAVENOUS

## 2014-11-28 MED ORDER — OXYTOCIN 40 UNITS IN LACTATED RINGERS INFUSION - SIMPLE MED
62.5000 mL/h | INTRAVENOUS | Status: DC
  Administered 2014-11-28: 62.5 mL/h via INTRAVENOUS
  Filled 2014-11-28: qty 1000

## 2014-11-28 MED ORDER — TERBUTALINE SULFATE 1 MG/ML IJ SOLN
0.2500 mg | Freq: Once | INTRAMUSCULAR | Status: AC | PRN
  Filled 2014-11-28: qty 1

## 2014-11-28 MED ORDER — LACTATED RINGERS IV SOLN
500.0000 mL | INTRAVENOUS | Status: DC | PRN
  Administered 2014-11-28: 500 mL via INTRAVENOUS

## 2014-11-28 MED ORDER — EPHEDRINE 5 MG/ML INJ
10.0000 mg | INTRAVENOUS | Status: DC | PRN
  Filled 2014-11-28: qty 2

## 2014-11-28 NOTE — H&P (Signed)
Samantha Lewis is a 29 y.o. female presenting for decreased FM. Maternal Medical History:  Reason for admission: Decreased FM.  Postdates.   Fetal activity: Perceived fetal activity is decreased.   Last perceived fetal movement was within the past 24 hours.    Prenatal complications: no prenatal complications Prenatal Complications - Diabetes: none.    OB History    Gravida Para Term Preterm AB TAB SAB Ectopic Multiple Living   5 2 2  2 1  1  2      Past Medical History  Diagnosis Date  . Chlamydia   . Gonorrhea   . Anemia    Past Surgical History  Procedure Laterality Date  . Ectopic pregnancy surgery     Family History: family history includes Arthritis in her mother; Asthma in her mother; Depression in her mother; Hypertension in her mother. Social History:  reports that she has quit smoking. Her smoking use included Cigarettes. She has quit using smokeless tobacco. She reports that she does not drink alcohol or use illicit drugs.   Prenatal Transfer Tool  Maternal Diabetes: No Genetic Screening: Normal Maternal Ultrasounds/Referrals: Normal Fetal Ultrasounds or other Referrals:  None Maternal Substance Abuse:  No Significant Maternal Medications:  None Significant Maternal Lab Results:  None Other Comments:  None  Review of Systems  All other systems reviewed and are negative.   Dilation: Closed Effacement (%): 50 Station: -3 Exam by:: Rachelle Denney,CNM Blood pressure 125/76, pulse 74, temperature 98.6 F (37 C), temperature source Oral, resp. rate 20, height 5\' 6"  (1.676 m), weight 234 lb (106.142 kg), last menstrual period 02/16/2014. Maternal Exam:  Abdomen: Patient reports no abdominal tenderness. Fetal presentation: vertex  Introitus: Normal vulva. Normal vagina.  Pelvis: adequate for delivery.   Cervix: Cervix evaluated by digital exam.     Physical Exam  Nursing note and vitals reviewed. Constitutional: She is oriented to person, place, and  time. She appears well-developed and well-nourished.  HENT:  Head: Normocephalic and atraumatic.  Eyes: Conjunctivae are normal. Pupils are equal, round, and reactive to light.  Neck: Normal range of motion. Neck supple.  Cardiovascular: Normal rate and regular rhythm.   Respiratory: Effort normal and breath sounds normal.  GI: Soft. Bowel sounds are normal.  Genitourinary: Vagina normal and uterus normal.  Musculoskeletal: Normal range of motion.  Neurological: She is alert and oriented to person, place, and time.  Skin: Skin is warm and dry.  Psychiatric: She has a normal mood and affect. Her behavior is normal. Judgment and thought content normal.    Prenatal labs: ABO, Rh: --/--/O POS (04/19 [id number]) Antibody: NEG (04/19 0851) Rubella:   RPR: NON REAC (01/11 1308)  HBsAg:    HIV: NONREACTIVE (01/11 1308)  GBS: NOT DETECTED (03/11 1131)   Assessment/Plan: 40.5 weeks.  Decreased FM.  Admitted for 2 stage IOL.   Ramatoulaye Pack,[place] 11/28/2014, 1:09 PM

## 2014-11-28 NOTE — Anesthesia Preprocedure Evaluation (Signed)
Anesthesia Evaluation  Patient identified by MRN, date of birth, ID band Patient awake    Reviewed: Allergy & Precautions, H&P , NPO status , Patient's Chart, lab work & pertinent test results  Airway Mallampati: II  TM Distance: >3 FB Neck ROM: full    Dental no notable dental hx.    Pulmonary former smoker,    Pulmonary exam normal       Cardiovascular negative cardio ROS      Neuro/Psych negative neurological ROS  negative psych ROS   GI/Hepatic negative GI ROS, Neg liver ROS,   Endo/Other  Morbid obesity  Renal/GU negative Renal ROS     Musculoskeletal   Abdominal (+) + obese,   Peds  Hematology   Anesthesia Other Findings   Reproductive/Obstetrics (+) Pregnancy                             Anesthesia Physical Anesthesia Plan  ASA: III  Anesthesia Plan: Epidural   Post-op Pain Management:    Induction:   Airway Management Planned:   Additional Equipment:   Intra-op Plan:   Post-operative Plan:   Informed Consent: I have reviewed the patients History and Physical, chart, labs and discussed the procedure including the risks, benefits and alternatives for the proposed anesthesia with the patient or authorized representative who has indicated his/her understanding and acceptance.     Plan Discussed with:   Anesthesia Plan Comments:         Anesthesia Quick Evaluation

## 2014-11-28 NOTE — Anesthesia Procedure Notes (Signed)
Epidural Patient location during procedure: OB Start time: 11/28/2014 5:22 PM End time: 11/28/2014 5:26 PM  Staffing Anesthesiologist: Leilani AbleHATCHETT, Shamal Stracener Performed by: anesthesiologist   Preanesthetic Checklist Completed: patient identified, surgical consent, pre-op evaluation, timeout performed, IV checked, risks and benefits discussed and monitors and equipment checked  Epidural Patient position: sitting Prep: site prepped and draped and DuraPrep Patient monitoring: continuous pulse ox and blood pressure Approach: midline Location: L3-L4 Injection technique: LOR air  Needle:  Needle type: Tuohy  Needle gauge: 17 G Needle length: 9 cm and 9 Needle insertion depth: 6 cm Catheter type: closed end flexible Catheter size: 19 Gauge Catheter at skin depth: 11 cm Test dose: negative and Other  Assessment Sensory level: T9 Events: blood not aspirated, injection not painful, no injection resistance, negative IV test and no paresthesia  Additional Notes Reason for block:procedure for pain

## 2014-11-28 NOTE — Progress Notes (Signed)
Samantha Lewis is a 29 y.o. [id number] at 8396w5d by LMP admitted for induction of labor due to Post dates. Due date 11/23/14.  Subjective: No complaints, desires healthy delivery.  Plans for pain control IV narcotics followed by epidural.  Did not sleep or eat overnight.  Pelvis proven to 8#13oz.    Objective: Temp(Src) 98.6 F (37 C) (Oral)  Resp 20  Ht 5\' 6"  (1.676 m)  Wt 106.142 kg (234 lb)  BMI 37.79 kg/m2  LMP 02/16/2014    EFW: 8#  FHT:  FHR: 140 bpm, variability: moderate,  accelerations:  Present,  decelerations:  Absent UC:   irregular, every 10 minutes SVE:   Dilation: Closed Effacement (%): 50 Station: -3 Exam by:: Roschelle Calandra,CNM  Labs: Lab Results  Component Value Date   WBC 4.8 11/28/2014   HGB 10.4* 11/28/2014   HCT 32.0* 11/28/2014   MCV 83.6 11/28/2014   PLT 155 11/28/2014    Assessment / Plan: IOL for postterm multigravida.  Plan for Cytotec PO and ambulation.    Labor: Progressing normally Preeclampsia:  N/A Fetal Wellbeing:  Category I Pain Control:  Pain management with IV push, then epidural at patient request I/D:  n/a Anticipated MOD:  NSVD  Orvilla CornwallDenney, Rosie Torrez A 11/28/2014, 9:21 AM

## 2014-11-29 LAB — CBC
HCT: 31.4 % — ABNORMAL LOW (ref 36.0–46.0)
Hemoglobin: 10.1 g/dL — ABNORMAL LOW (ref 12.0–15.0)
MCH: 26.9 pg (ref 26.0–34.0)
MCHC: 32.2 g/dL (ref 30.0–36.0)
MCV: 83.5 fL (ref 78.0–100.0)
PLATELETS: 146 10*3/uL — AB (ref 150–400)
RBC: 3.76 MIL/uL — ABNORMAL LOW (ref 3.87–5.11)
RDW: 14 % (ref 11.5–15.5)
WBC: 7.8 10*3/uL (ref 4.0–10.5)

## 2014-11-29 LAB — CCBB MATERNAL DONOR DRAW

## 2014-11-29 LAB — RUBELLA ANTIBODY, IGM

## 2014-11-29 MED ORDER — SODIUM CHLORIDE 0.9 % IJ SOLN
3.0000 mL | INTRAMUSCULAR | Status: DC | PRN
  Administered 2014-11-29: 3 mL via INTRAVENOUS

## 2014-11-29 MED ORDER — OXYCODONE-ACETAMINOPHEN 5-325 MG PO TABS
2.0000 | ORAL_TABLET | ORAL | Status: DC | PRN
  Administered 2014-11-29: 2 via ORAL
  Filled 2014-11-29: qty 2

## 2014-11-29 MED ORDER — ZOLPIDEM TARTRATE 5 MG PO TABS: 5.0000 mg | ORAL_TABLET | Freq: Every evening | ORAL | Status: DC | PRN

## 2014-11-29 MED ORDER — SENNOSIDES-DOCUSATE SODIUM 8.6-50 MG PO TABS
2.0000 | ORAL_TABLET | ORAL | Status: DC
  Administered 2014-11-30: 2 via ORAL
  Filled 2014-11-29: qty 2

## 2014-11-29 MED ORDER — OB COMPLETE PETITE 35-5-1-200 MG PO CAPS: 1.0000 | ORAL_CAPSULE | Freq: Every day | ORAL | Status: DC

## 2014-11-29 MED ORDER — METHYLERGONOVINE MALEATE 0.2 MG PO TABS: 0.2000 mg | ORAL_TABLET | ORAL | Status: DC | PRN

## 2014-11-29 MED ORDER — FERROUS SULFATE 325 (65 FE) MG PO TABS
325.0000 mg | ORAL_TABLET | Freq: Two times a day (BID) | ORAL | Status: DC
  Administered 2014-11-29 (×2): 325 mg via ORAL
  Filled 2014-11-29 (×2): qty 1

## 2014-11-29 MED ORDER — DIBUCAINE 1 % RE OINT: 1.0000 "application " | TOPICAL_OINTMENT | RECTAL | Status: DC | PRN

## 2014-11-29 MED ORDER — LANOLIN HYDROUS EX OINT: TOPICAL_OINTMENT | CUTANEOUS | Status: DC | PRN

## 2014-11-29 MED ORDER — ACETAMINOPHEN 325 MG PO TABS: 650.0000 mg | ORAL_TABLET | ORAL | Status: DC | PRN

## 2014-11-29 MED ORDER — TETANUS-DIPHTH-ACELL PERTUSSIS 5-2.5-18.5 LF-MCG/0.5 IM SUSP: 0.5000 mL | Freq: Once | INTRAMUSCULAR | Status: DC

## 2014-11-29 MED ORDER — OXYTOCIN 40 UNITS IN LACTATED RINGERS INFUSION - SIMPLE MED
62.5000 mL/h | INTRAVENOUS | Status: DC | PRN
  Administered 2014-11-29: 62.5 mL/h via INTRAVENOUS
  Filled 2014-11-29: qty 1000

## 2014-11-29 MED ORDER — OXYCODONE-ACETAMINOPHEN 5-325 MG PO TABS
1.0000 | ORAL_TABLET | ORAL | Status: DC | PRN
  Administered 2014-11-29 (×4): 1 via ORAL
  Filled 2014-11-29 (×4): qty 1

## 2014-11-29 MED ORDER — WITCH HAZEL-GLYCERIN EX PADS: 1.0000 "application " | MEDICATED_PAD | CUTANEOUS | Status: DC | PRN

## 2014-11-29 MED ORDER — DIPHENHYDRAMINE HCL 25 MG PO CAPS: 25.0000 mg | ORAL_CAPSULE | Freq: Four times a day (QID) | ORAL | Status: DC | PRN

## 2014-11-29 MED ORDER — PRENATAL MULTIVITAMIN CH
1.0000 | ORAL_TABLET | Freq: Every day | ORAL | Status: DC
  Administered 2014-11-29: 1 via ORAL
  Filled 2014-11-29: qty 1

## 2014-11-29 MED ORDER — MEASLES, MUMPS & RUBELLA VAC ~~LOC~~ INJ
0.5000 mL | INJECTION | Freq: Once | SUBCUTANEOUS | Status: DC
  Filled 2014-11-29: qty 0.5

## 2014-11-29 MED ORDER — ONDANSETRON HCL 4 MG/2ML IJ SOLN: 4.0000 mg | INTRAMUSCULAR | Status: DC | PRN

## 2014-11-29 MED ORDER — SIMETHICONE 80 MG PO CHEW: 80.0000 mg | CHEWABLE_TABLET | ORAL | Status: DC | PRN

## 2014-11-29 MED ORDER — IBUPROFEN 600 MG PO TABS
600.0000 mg | ORAL_TABLET | Freq: Four times a day (QID) | ORAL | Status: DC
  Administered 2014-11-29 – 2014-11-30 (×6): 600 mg via ORAL
  Filled 2014-11-29 (×6): qty 1

## 2014-11-29 MED ORDER — BENZOCAINE-MENTHOL 20-0.5 % EX AERO: 1.0000 "application " | INHALATION_SPRAY | CUTANEOUS | Status: DC | PRN

## 2014-11-29 MED ORDER — ONDANSETRON HCL 4 MG PO TABS
4.0000 mg | ORAL_TABLET | ORAL | Status: DC | PRN
Start: 2014-11-29 — End: 2014-11-30

## 2014-11-29 MED ORDER — METHYLERGONOVINE MALEATE 0.2 MG/ML IJ SOLN
0.2000 mg | INTRAMUSCULAR | Status: DC | PRN
Start: 2014-11-29 — End: 2014-11-30

## 2014-11-29 NOTE — Lactation Note (Signed)
This note was copied from the chart of Samantha Althea Charonrin Mcelhinny. Lactation Consultation Note: First time BF mom. Reports baby latches well but is still fussy after feedings and she is not sure how much she is getting. Has been giving bottles of formula. Just had formula about 30 min ago and is asleep in dad's arms. Encouraged to always breast feed first to promote good milk supply and then give formula if baby is till hungry Reviewed hand expression and mom able to hand express Colostrum. BF brochure given with resources for support after DC. To call for assist prn. No questions at present,  Patient Name: Samantha Lewis [id number]'s Date: 11/29/2014 Reason for consult: Initial assessment   Maternal Data Formula Feeding for Exclusion: No Has patient been taught Hand Expression?: Yes Does the patient have breastfeeding experience prior to this delivery?: No  Feeding    LATCH Score/Interventions                      Lactation Tools Discussed/Used     Consult Status Consult Status: Follow-up Date: 11/30/14 Follow-up type: In-patient    Pamelia HoitWeeks, Gracelyn Coventry D 11/29/2014, 3:04 PM

## 2014-11-29 NOTE — Plan of Care (Signed)
Problem: Discharge Progression Outcomes Goal: MMR given as ordered Outcome: Not Applicable Date Met:  65/82/60 Rubella immune

## 2014-11-29 NOTE — Progress Notes (Signed)
Pt has not had heavy blleeding for 12 hours and no clots IV saline locked LR w/ Pitocin bag #2 discontinued

## 2014-11-29 NOTE — Anesthesia Postprocedure Evaluation (Signed)
  Anesthesia Post-op Note  Patient: Samantha Lewis  Procedure(s) Performed: * No procedures listed *  Patient Location: PACU and Mother/Baby  Anesthesia Type:Epidural  Level of Consciousness: awake, alert  and oriented  Airway and Oxygen Therapy: Patient Spontanous Breathing  Post-op Pain: mild  Post-op Assessment: Post-op Vital signs reviewed, Patient's Cardiovascular Status Stable, Respiratory Function Stable, No signs of Nausea or vomiting, Adequate PO intake, Pain level controlled, No headache, No backache, No residual numbness and No residual motor weakness  Post-op Vital Signs: Reviewed and stable  Last Vitals:  Filed Vitals:   11/29/14 0501  BP: 104/59  Pulse: 61  Temp: 36.8 C  Resp: 20    Complications: No apparent anesthesia complications

## 2014-11-29 NOTE — Progress Notes (Signed)
Ur chart review completed.  

## 2014-11-29 NOTE — Plan of Care (Signed)
Problem: Discharge Progression Outcomes Goal: MMR given as ordered Outcome: Not Progressing Rubella- pending

## 2014-11-29 NOTE — Progress Notes (Signed)
Post Partum Day 1 Subjective: no complaints  Objective: Blood pressure 104/59, pulse 61, temperature 98.3 F (36.8 C), temperature source Oral, resp. rate 20, height 5\' 6"  (1.676 m), weight 234 lb (106.142 kg), last menstrual period 02/16/2014, SpO2 98 %, unknown if currently breastfeeding.  Physical Exam:  General: alert and no distress Lochia: appropriate Uterine Fundus: firm Incision: none DVT Evaluation: No evidence of DVT seen on physical exam.   Recent Labs  11/28/14 0851 11/29/14 0535  HGB 10.4* 10.1*  HCT 32.0* 31.4*    Assessment/Plan: Plan for discharge tomorrow   LOS: 1 day   HARPER,Hanf A 11/29/2014, 7:20 AM

## 2014-11-30 ENCOUNTER — Inpatient Hospital Stay (HOSPITAL_COMMUNITY): Admission: RE | Admit: 2014-11-30 | Payer: Medicaid Other | Source: Ambulatory Visit

## 2014-11-30 MED ORDER — OXYCODONE-ACETAMINOPHEN 5-325 MG PO TABS: 1.0000 | ORAL_TABLET | ORAL | Status: AC | PRN

## 2014-11-30 MED ORDER — IBUPROFEN 600 MG PO TABS: 600.0000 mg | ORAL_TABLET | Freq: Four times a day (QID) | ORAL | Status: DC | PRN

## 2014-11-30 NOTE — Lactation Note (Signed)
This note was copied from the chart of Girl Althea Charonrin Braman. Lactation Consultation Note  Patient Name: Girl Althea Charonrin Gow OZHYQ'MToday's Date: 11/30/2014 Reason for consult: Follow-up assessment   With this mom of a term baby, now 7834 hours old. Mom has easily expressed, flowing colostrum. Mom describes the baby cluster feeding. Mom feels she does not have enough milk due to this. i explained her cluster feeidng is natures way of bring in her milk, and that  Within 24 hours, her breasts should begin getting fuller, and she should transition into matrue milk. At this time her baby should be more satisfied, and should not need supplementation with formula. Mom receptive to teaching. Breast care,engorgemnt, lactation services also reviewed with mom.    Maternal Data    Feeding Feeding Type: Breast Fed Length of feed: 20 min  LATCH Score/Interventions                      Lactation Tools Discussed/Used     Consult Status Consult Status: Complete Follow-up type: Call as needed    Alfred LevinsLee, Kai Calico Anne 11/30/2014, 8:24 AM

## 2014-11-30 NOTE — Progress Notes (Signed)
Post Partum Day 1 Subjective: no complaints  Objective: Blood pressure 108/59, pulse 67, temperature 98.3 F (36.8 C), temperature source Axillary, resp. rate 19, height 5\' 6"  (1.676 m), weight 234 lb (106.142 kg), last menstrual period 02/16/2014, SpO2 100 %, unknown if currently breastfeeding.  Physical Exam:  General: alert and no distress Lochia: appropriate Uterine Fundus: firm Incision: none DVT Evaluation: No evidence of DVT seen on physical exam.   Recent Labs  11/28/14 0851 11/29/14 0535  HGB 10.4* 10.1*  HCT 32.0* 31.4*    Assessment/Plan: Discharge home   LOS: 2 days   Traeton Bordas,Pfeifle A 11/30/2014, 8:16 AM

## 2014-11-30 NOTE — Discharge Summary (Signed)
Obstetric Discharge Summary Reason for Admission: induction of labor Prenatal Procedures: NST and ultrasound Intrapartum Procedures: spontaneous vaginal delivery Postpartum Procedures: none Complications-Operative and Postpartum: none HEMOGLOBIN  Date Value Ref Range Status  11/29/2014 10.1* 12.0 - 15.0 g/dL Final   HCT  Date Value Ref Range Status  11/29/2014 31.4* 36.0 - 46.0 % Final    Physical Exam:  General: alert and no distress Lochia: appropriate Uterine Fundus: firm Incision: none DVT Evaluation: No evidence of DVT seen on physical exam.  Discharge Diagnoses: Term Pregnancy-delivered  Discharge Information: Date: 11/30/2014 Activity: pelvic rest Diet: routine Medications: PNV, Ibuprofen, Colace and Percocet Condition: stable Instructions: refer to practice specific booklet Discharge to: home Follow-up Information    Follow up with Heavan Francom,Auker A, MD. Schedule an appointment as soon as possible for a visit in 2 weeks.   Specialty:  Obstetrics and Gynecology   Contact information:   [address] [place] [id number] [phone]       Newborn Data: Live born female  Birth Weight: 7 lb 11.8 oz (3510 g) APGAR: 9, 9  Home with mother.  Samantha Lewis,Lybbert A 11/30/2014, 8:19 AM

## 2014-12-04 ENCOUNTER — Other Ambulatory Visit: Payer: Self-pay | Admitting: Obstetrics & Gynecology

## 2014-12-08 ENCOUNTER — Telehealth: Payer: Self-pay | Admitting: *Deleted

## 2014-12-08 DIAGNOSIS — F53 Postpartum depression: Secondary | ICD-10-CM

## 2014-12-08 DIAGNOSIS — O99345 Other mental disorders complicating the puerperium: Principal | ICD-10-CM

## 2014-12-08 MED ORDER — CITALOPRAM HYDROBROMIDE 20 MG PO TABS: 20.0000 mg | ORAL_TABLET | Freq: Every day | ORAL | Status: AC

## 2014-12-08 NOTE — Telephone Encounter (Signed)
Patient was seen at Pediatric office today and was very emotional. They feel she needs to be seen for post partum depression ASAP. Call to patient discussed her day- depression screening done over the phone- score-6. Patient has used antidepressant in past and is willing to to use again. Patient can not come for appointment today- so she will be seen on Monday. Rx will be forwarded to the pharmacy and the patient advised if her symptoms get worse to call the on call doctor and go to ED.Patient is not breast feeding.

## 2014-12-11 ENCOUNTER — Ambulatory Visit: Payer: Medicaid Other | Admitting: Obstetrics

## 2014-12-13 ENCOUNTER — Encounter: Payer: Self-pay | Admitting: Obstetrics

## 2014-12-13 ENCOUNTER — Ambulatory Visit (INDEPENDENT_AMBULATORY_CARE_PROVIDER_SITE_OTHER): Payer: Medicaid Other | Admitting: Obstetrics

## 2014-12-13 DIAGNOSIS — M62830 Muscle spasm of back: Secondary | ICD-10-CM

## 2014-12-13 DIAGNOSIS — D508 Other iron deficiency anemias: Secondary | ICD-10-CM

## 2014-12-13 DIAGNOSIS — Z3009 Encounter for other general counseling and advice on contraception: Secondary | ICD-10-CM

## 2014-12-13 MED ORDER — FUSION PLUS PO CAPS: 1.0000 | ORAL_CAPSULE | Freq: Every day | ORAL | Status: AC

## 2014-12-13 MED ORDER — IBUPROFEN 800 MG PO TABS: 800.0000 mg | ORAL_TABLET | Freq: Three times a day (TID) | ORAL | Status: AC | PRN

## 2014-12-13 MED ORDER — CYCLOBENZAPRINE HCL 10 MG PO TABS: 10.0000 mg | ORAL_TABLET | Freq: Three times a day (TID) | ORAL | Status: AC | PRN

## 2014-12-13 NOTE — Progress Notes (Signed)
Subjective:     Samantha Lewis is a 29 y.o. female who presents for a postpartum visit. She is 2 weeks postpartum following a spontaneous vaginal delivery. I have fully reviewed the prenatal and intrapartum course. The delivery was at 40.5 gestational weeks. Outcome: spontaneous vaginal delivery. Anesthesia: epidural. Postpartum course has been normal except for persistent backache. Baby's course has been normal. Baby is feeding by bottle - Enfamil with Iron. Bleeding thin lochia. Bowel function is normal. Bladder function is normal. Patient is not sexually active. Contraception method is abstinence. Postpartum depression screening: negative.  Tobacco, alcohol and substance abuse history reviewed.  Adult immunizations reviewed including TDAP, rubella and varicella.  The following portions of the patient's history were reviewed and updated as appropriate: allergies, current medications, past family history, past medical history, past social history, past surgical history and problem list.  Review of Systems Positive for occasional sadness Positive for myalgias  Objective:    BP 104/78 mmHg  Pulse 69  Wt 221 lb 12.8 oz (100.608 kg)  LMP 02/16/2014   PE:  Deferred   100% of 15 min visit spent on counseling and coordination of care.  Assessment:    2 weeks postpartum.  Doing well.  ? Postpartum Blues vs Depression  Backache  H/O Anemia Plan:    1. Contraception: abstinence 2.Follow closely for Depression.  Counseling done.  No meds at this time. 3.Ibuprofen / Flexeril for backache 4. Follow up in: 2 weeks or as needed.   Healthy lifestyle practices reviewed

## 2014-12-27 ENCOUNTER — Ambulatory Visit: Payer: Medicaid Other | Admitting: Obstetrics

## 2015-01-04 ENCOUNTER — Ambulatory Visit: Payer: Self-pay | Admitting: Obstetrics

## 2015-01-15 ENCOUNTER — Encounter: Payer: Self-pay | Admitting: Obstetrics

## 2015-01-15 ENCOUNTER — Ambulatory Visit (INDEPENDENT_AMBULATORY_CARE_PROVIDER_SITE_OTHER): Payer: Medicaid Other | Admitting: Obstetrics

## 2015-01-15 DIAGNOSIS — Z30013 Encounter for initial prescription of injectable contraceptive: Secondary | ICD-10-CM | POA: Diagnosis not present

## 2015-01-15 LAB — POCT URINE PREGNANCY: Preg Test, Ur: NEGATIVE

## 2015-01-15 MED ORDER — MEDROXYPROGESTERONE ACETATE 150 MG/ML IM SUSP: 150.0000 mg | INTRAMUSCULAR | Status: AC

## 2015-01-15 NOTE — Progress Notes (Signed)
Subjective:     Samantha Lewis is a 29 y.o. female who presents for a postpartum visit. She is 6 weeks postpartum following a spontaneous vaginal delivery. I have fully reviewed the prenatal and intrapartum course. The delivery was at 40.5 gestational weeks. Outcome: spontaneous vaginal delivery. Anesthesia: epidural. Postpartum course has been normal. Baby's course has been normal. Baby is feeding by bottle - Similac Advance. Bleeding no bleeding. Bowel function is normal. Bladder function is normal. Patient is sexually active. Contraception method is none. Postpartum depression screening: negative.  Tobacco, alcohol and substance abuse history reviewed.  Adult immunizations reviewed including TDAP, rubella and varicella.  The following portions of the patient's history were reviewed and updated as appropriate: allergies, current medications, past family history, past medical history, past social history, past surgical history and problem list.  Review of Systems A comprehensive review of systems was negative.   Objective:    BP 111/80 mmHg  Pulse 57  Temp(Src) 97.9 F (36.6 C)  Ht 5\' 7"  (1.702 m)  Wt 218 lb (98.884 kg)  BMI 34.14 kg/m2  Breastfeeding? No  General:  alert and no distress   Breasts:  inspection negative, no nipple discharge or bleeding, no masses or nodularity palpable  Lungs: clear to auscultation bilaterally  Heart:  regular rate and rhythm, S1, S2 normal, no murmur, click, rub or gallop  Abdomen: normal findings: soft, non-tender   Vulva:  normal  Vagina: normal vagina, no discharge, exudate, lesion, or erythema  Cervix:  no cervical motion tenderness  Corpus: normal size, contour, position, consistency, mobility, non-tender  Adnexa:  no mass, fullness, tenderness  Rectal Exam: Not performed.           Assessment:     Normal postpartum exam. Pap smear not done at today's visit.   Contraceptive management.  Wants Depo.   Plan:    1. Contraception:  Depo-Provera injections 2. Depo Provera Rx 3. Follow up in: 2 weeks or as needed.   Healthy lifestyle practices reviewed

## 2015-01-15 NOTE — Patient Instructions (Signed)
Contraceptive Injection Information  Contraceptive injections protect against pregnancy. Progesterone-only injections are given every 3 months to prevent pregnancy. These injections contain synthetic progesterone hormone. This synthetic progesterone hormone stops the ovaries from releasing eggs. It also thickens cervical mucus and changes the uterine lining.  Your health care provider will make sure you are a good candidate for contraceptive injections. Discuss the possible side effects of the injection with your health care provider.  ADVANTAGES  · They are highly effective and reversible.  · They slow down the flow of heavy menstrual periods.  · They control cramps and painful menstrual periods.  · Some women no longer get their period.  · They are effective in preventing pregnancy when used correctly.  · You are always protected from getting pregnant when you get the injection on time.  DISADVANTAGES  · They can be associated with weight gain and irregular bleeding.  · They do not protect against sexually transmitted diseases (STDs).  · You must visit your health care provider every 3 months.  · The injections may be uncomfortable.  · They may cost more than other methods of birth control.  · It can take between 6 months and 2 years to be able to get pregnant (fertility).  · They may also cause bone loss.  Document Released: 07/17/2011 Document Revised: 03/30/2013 Document Reviewed: 01/25/2013  Exit[employer]® Patient Information ©2015 [employer]. This information is not intended to replace advice given to you by your health care provider. Make sure you discuss any questions you have with your health care provider.

## 2015-01-29 ENCOUNTER — Ambulatory Visit: Payer: Medicaid Other

## 2015-02-05 ENCOUNTER — Ambulatory Visit: Payer: Medicaid Other

## 2015-10-31 IMAGING — US US OB TRANSVAGINAL
1 series · 13 of 28 positions shown · non-contrast
Comparison: None.

CLINICAL DATA: Pain, cramping.

EXAM:
OBSTETRIC <14 WK US AND TRANSVAGINAL OB US
TECHNIQUE: Both transabdominal and transvaginal ultrasound examinations were
performed for complete evaluation of the gestation as well as the
maternal uterus, adnexal regions, and pelvic cul-de-sac.
Transvaginal technique was performed to assess early pregnancy.

[Series 1: us ob comp less 14 wks · 13 of 54 slices shown]
[im 2/54]
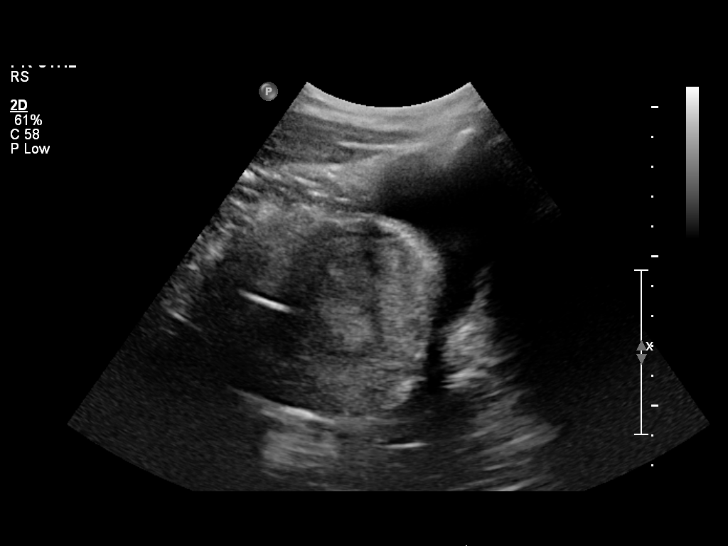
[im 6/54]
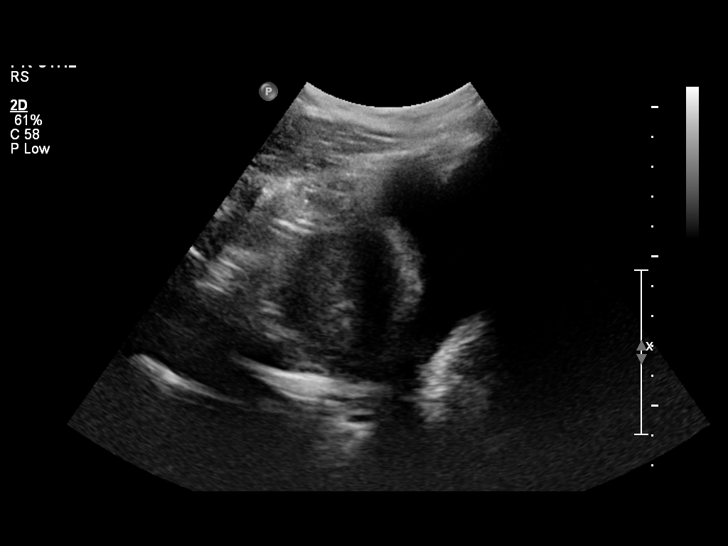
[im 10/54]
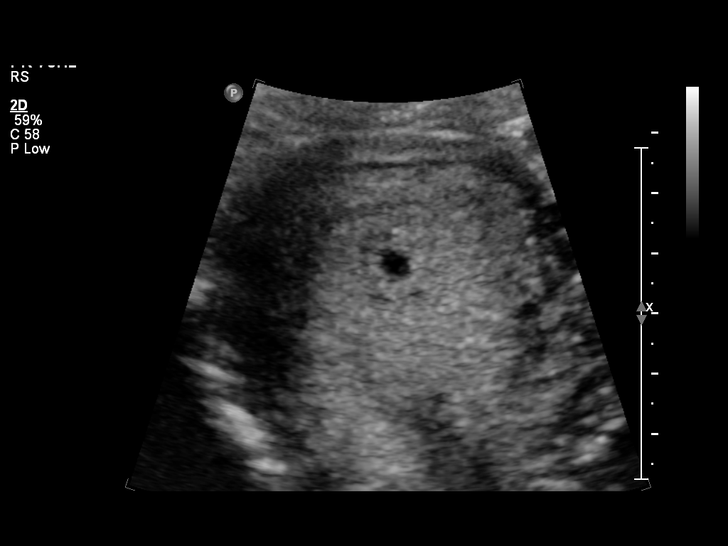
[im 14/54]
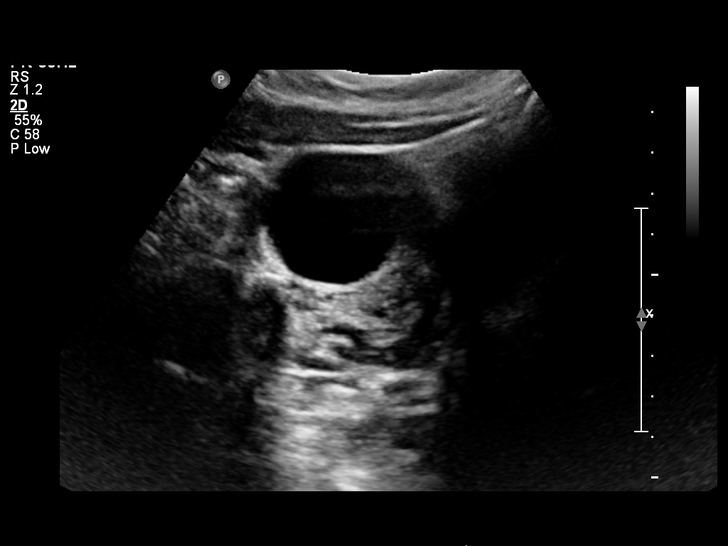
[im 18/54]
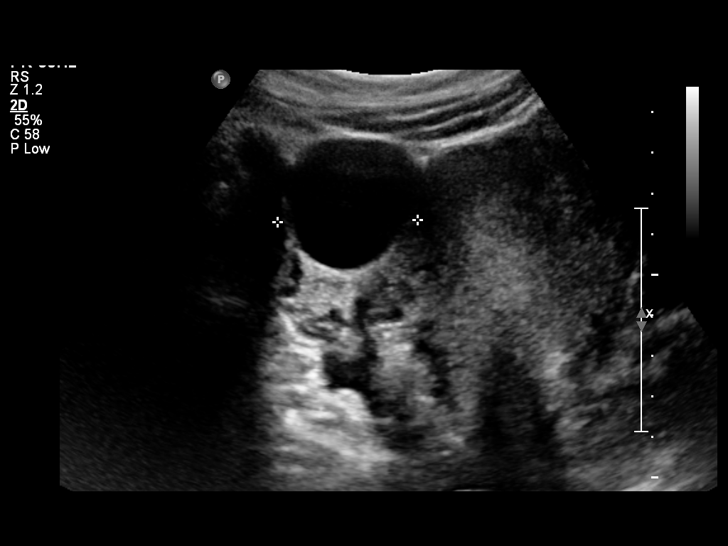
[im 22/54]
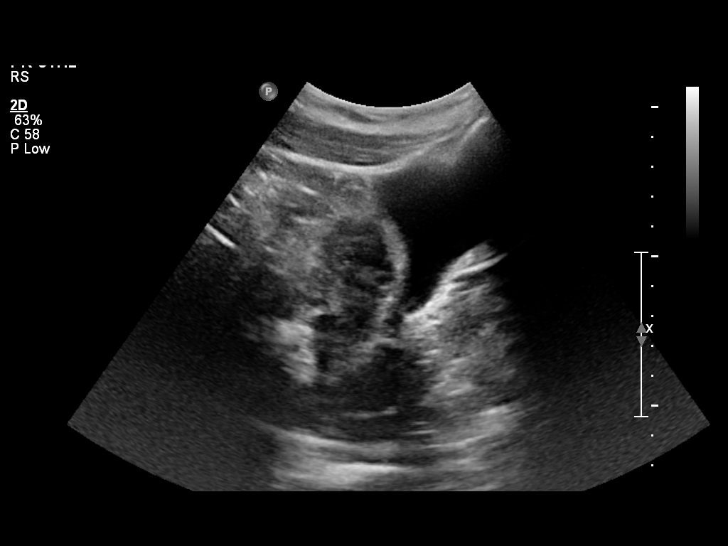
[im 28/54]
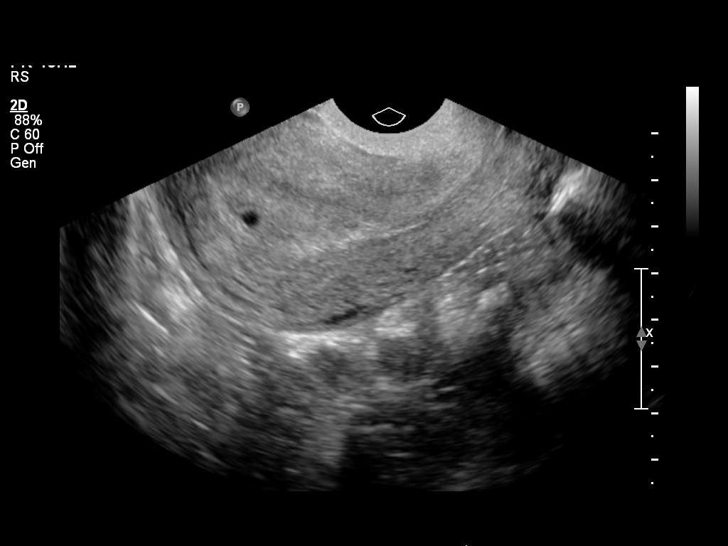
[im 32/54]
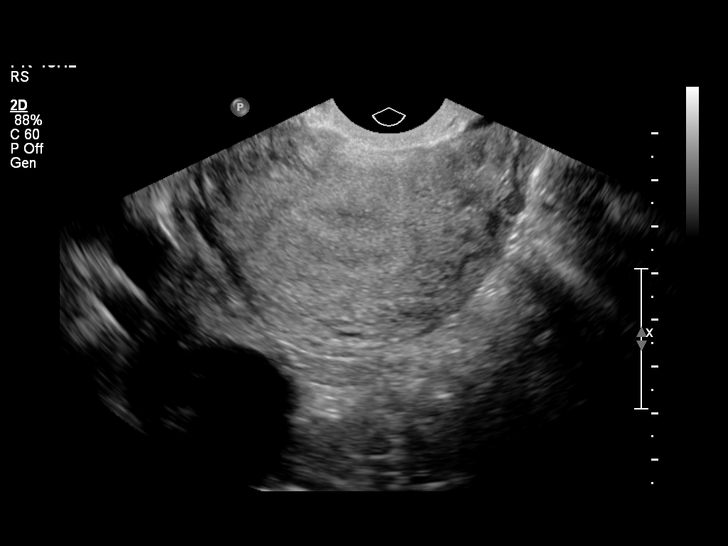
[im 36/54]
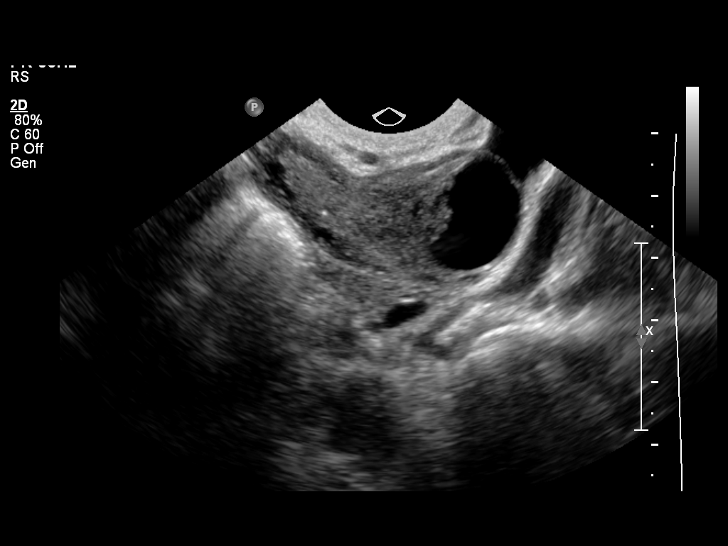
[im 40/54]
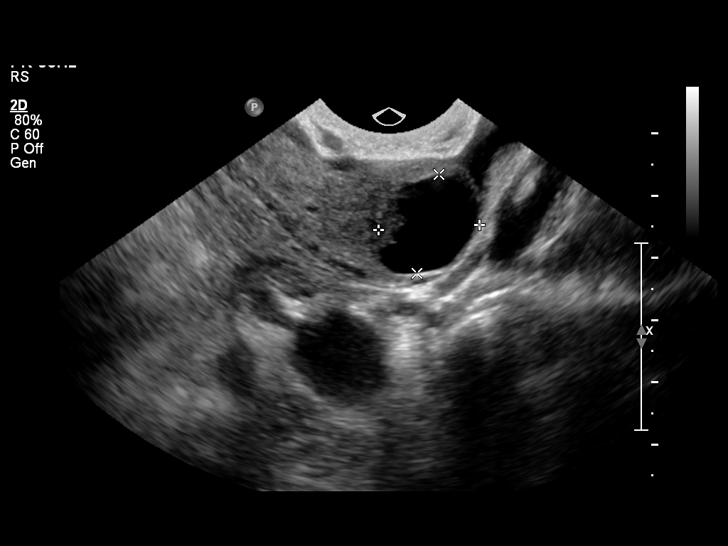
[im 44/54]
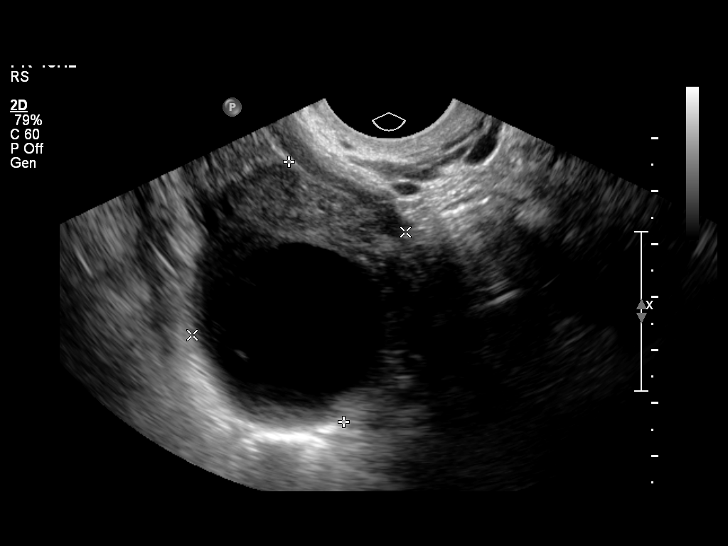
[im 48/54]
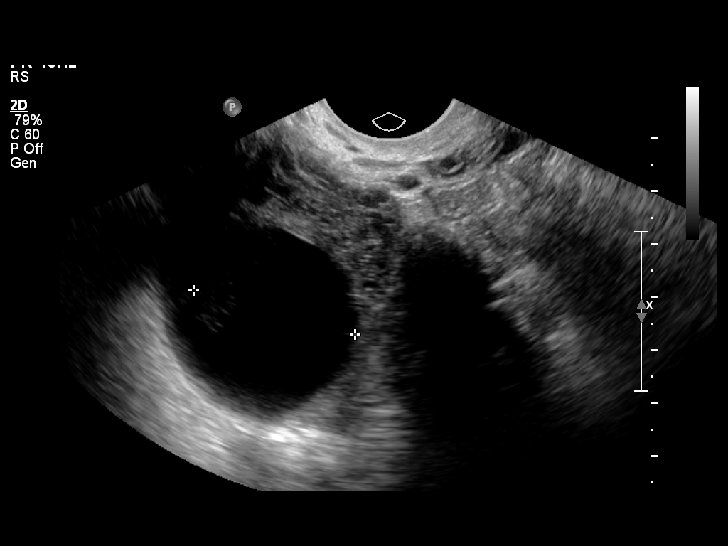
[im 52/54]
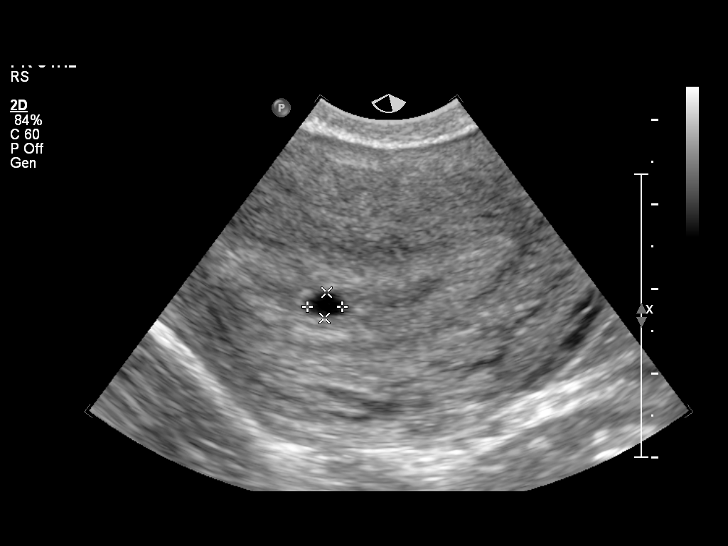

[13 of 28 positions shown; findings below may reference images not displayed]

FINDINGS: Intrauterine gestational sac: Possible intrauterine gestational
measuring 4.5 mm

Yolk sac:  Not present

Embryo:  Not present

Cardiac Activity: Not present

Maternal uterus/adnexae: The right ovary measures 5.0 x 4.8 x
cm. There is a 3.3 x 3.3 x 3.2 cm right ovarian cyst.

The left ovary measures 4.1 x 2.2 x 2.6 cm. Prominent left ovarian
follicle versus corpus luteum.
IMPRESSION: Probable early intrauterine gestational sac, but no yolk sac, fetal
pole, or cardiac activity yet visualized. Recommend follow-up
quantitative B-HCG levels and follow-up US in 14 days to confirm and
assess viability. This recommendation follows SRU consensus
guidelines: Diagnostic Criteria for Nonviable Pregnancy Early in the
First Trimester. N Engl J Med 6477; [DATE].

The right ovary is enlarged and contains a simple cyst. Given the
cyst and enlargement of the right ovary, if pain localizes to this
location, intermittent torsion would not be excluded.

These results will be called to the ordering clinician or
representative by the Radiologist Assistant, and communication
documented in the PACS or zVision Dashboard.

## 2015-11-08 IMAGING — US US OB TRANSVAGINAL
1 series · 14 of 28 positions shown · non-contrast
Comparison: 03/23/2014

CLINICAL DATA: Followup fetal viability

EXAM:
TRANSVAGINAL OB ULTRASOUND
TECHNIQUE: Transvaginal ultrasound was performed for complete evaluation of the
gestation as well as the maternal uterus, adnexal regions, and
pelvic cul-de-sac.

[Series 1: us ob transvaginal · 43 acquisitions, 14 frames shown]
[im 2/43]
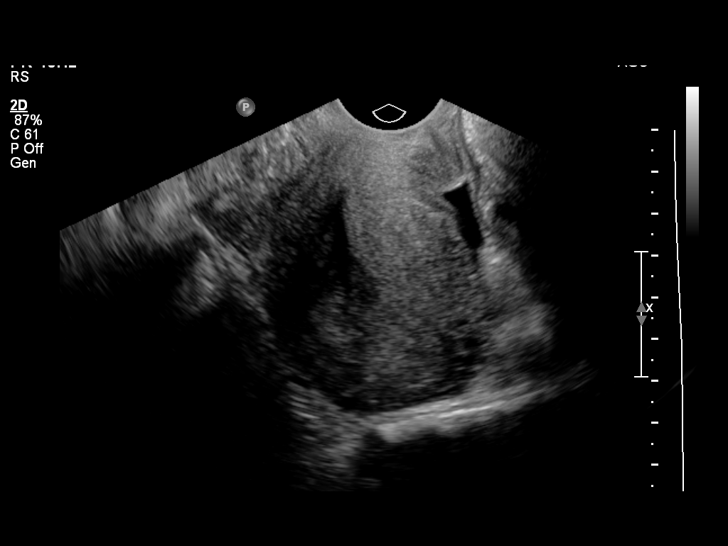
[im 5/43]
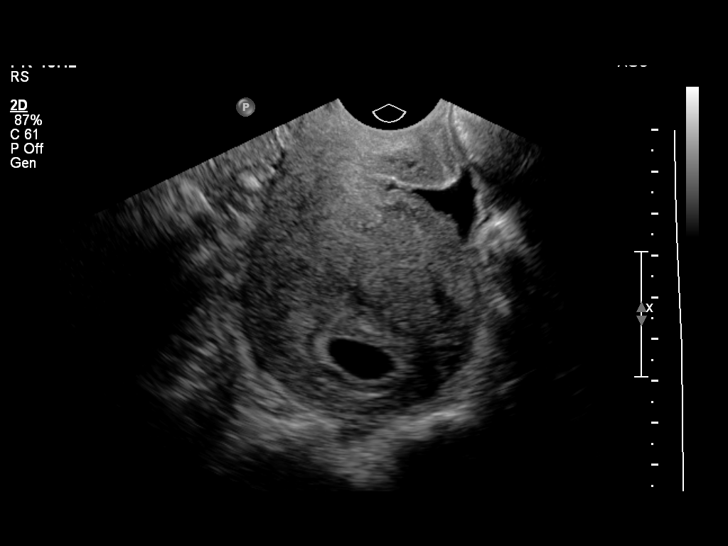
[im 8/43]
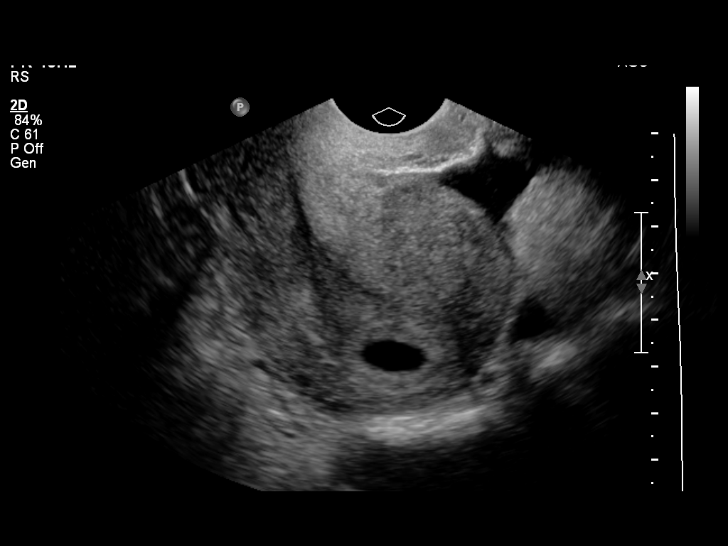
[im 11/43]
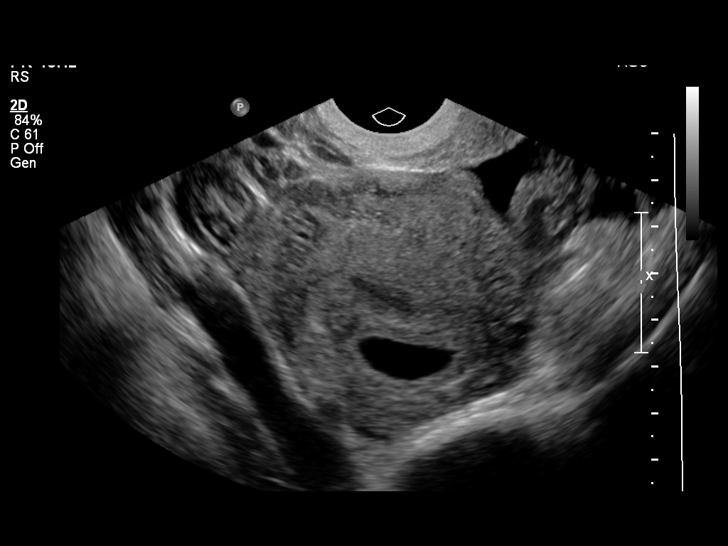
[im 15/43]
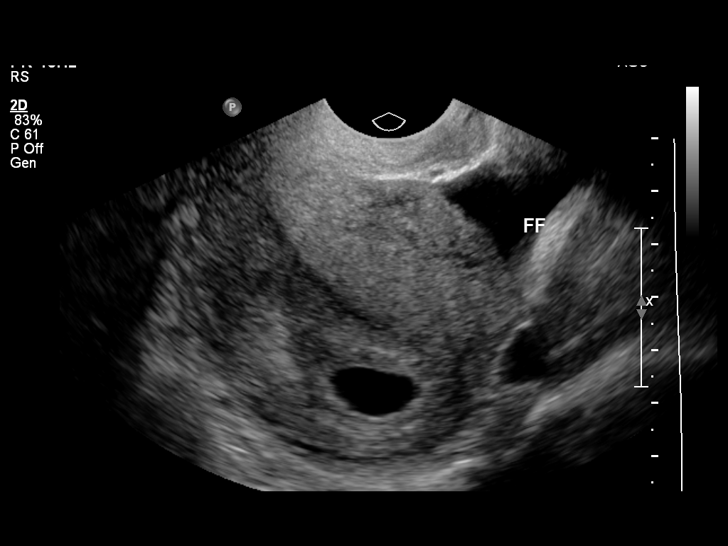
[im 18/43]
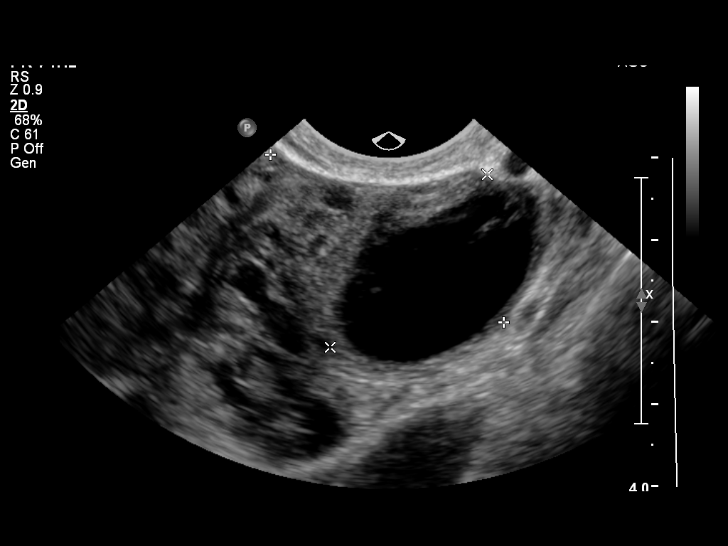
[im 21/43]
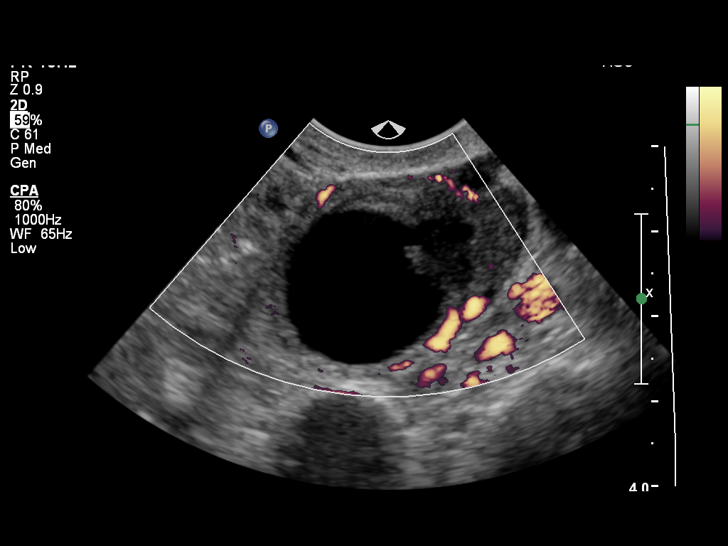
[im 24/43]
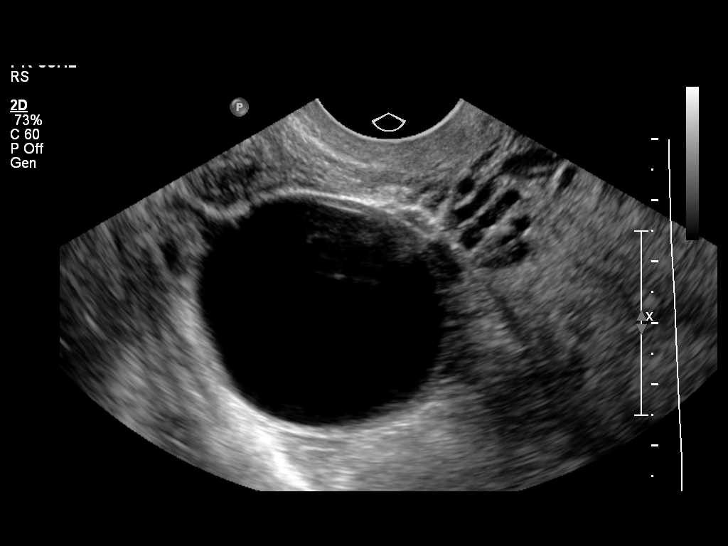
[im 27/43]
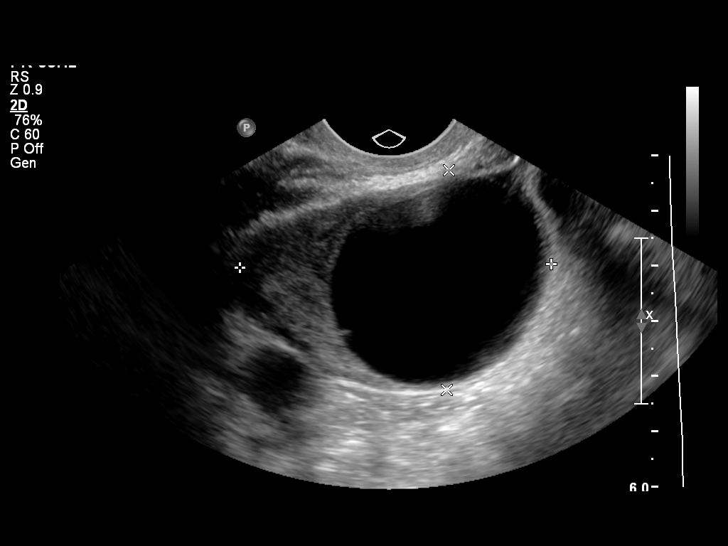
[im 30/43]
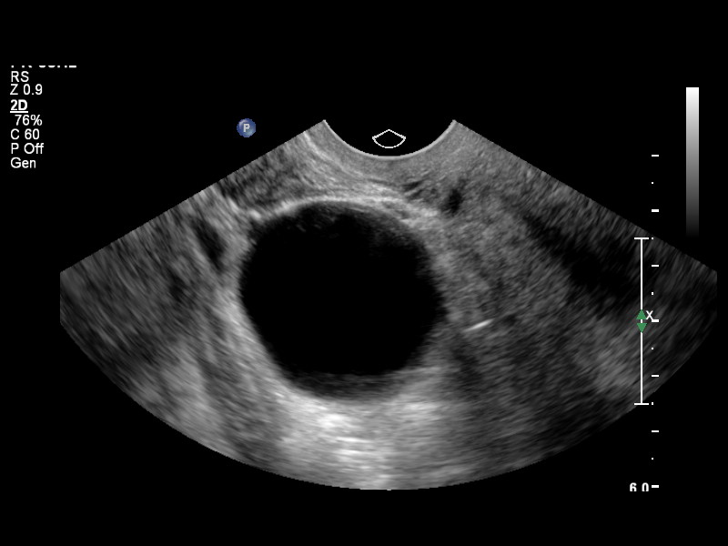
[im 33/43]
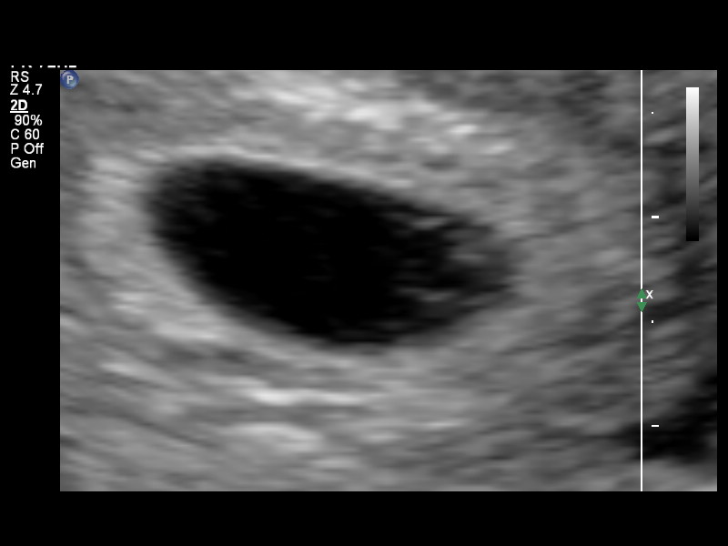
[im 36/43]
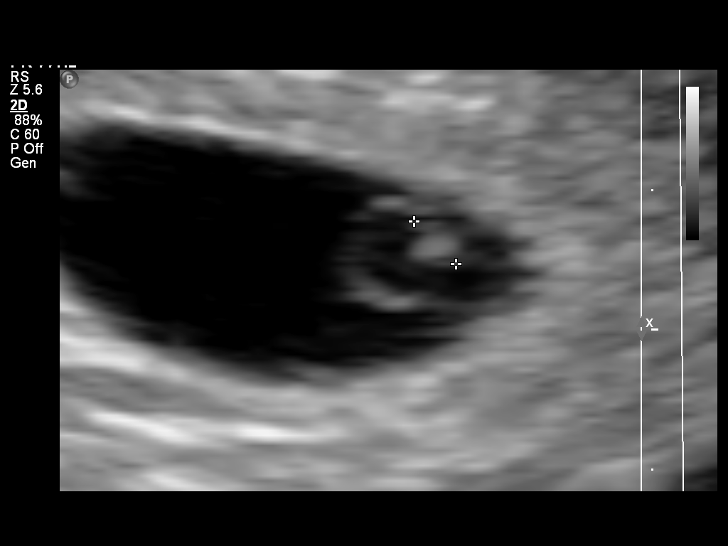
[im 39/43]
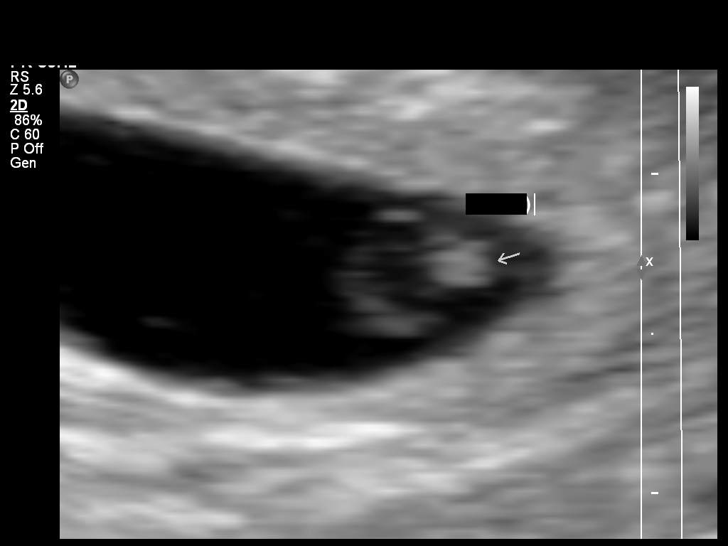
[im 43/43]
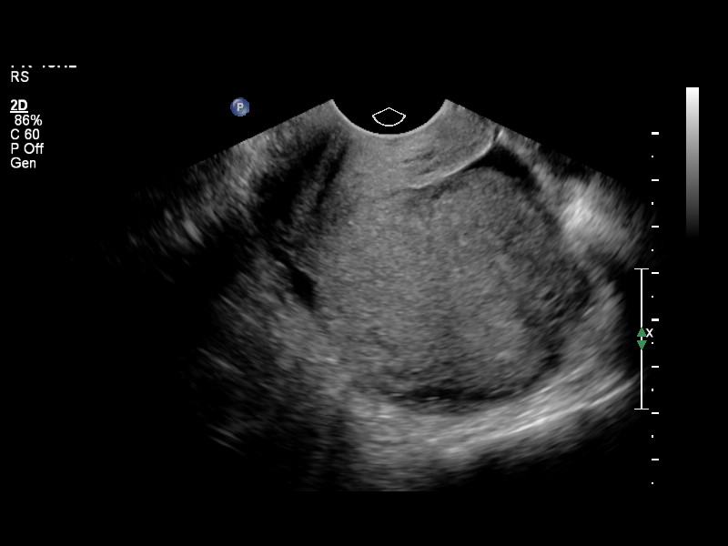

[14 of 28 positions shown; findings below may reference images not displayed]

FINDINGS: Intrauterine gestational sac: Visualized/normal in shape.

Yolk sac:  Present

Embryo:  Present

Cardiac Activity: Present

Heart Rate: 91 bpm

CRL:   2.1  mm   5 w 6 d                  US EDC: 11/25/2014

Maternal uterus/adnexae:

No subchorionic hemorrhage.

RIGHT ovary measures 5.6 x 4.0 x 4.5 cm and contains a large simple
cyst measuring 4.1 x 3.4 x 3.9 cm.

LEFT ovary measures 3.5 x 2.8 x 3.1 cm and contains a potential
corpus luteal cyst 2.9 x 1.7 x 1.8 cm.

Small amount of free pelvic fluid.

No adnexal masses.
IMPRESSION: Single live intrauterine gestational as above.

BILATERAL ovarian cysts, larger on RIGHT.

Small amount of nonspecific free pelvic fluid.

## 2016-01-16 IMAGING — US US OB COMP +14 WK
1 series · 12 of 28 positions shown · non-contrast
Comparison: none

[Series 1: us ob comp +14 wk mfm · 82 acquisitions, 12 frames shown]
[im 4/82]
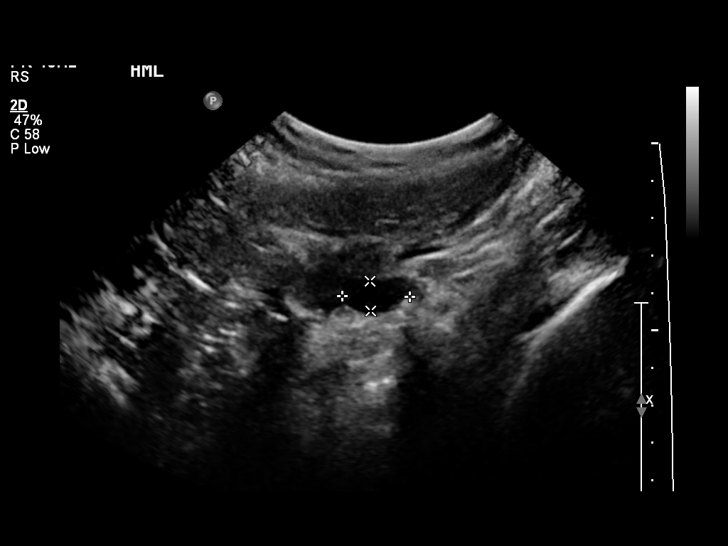
[im 10/82]
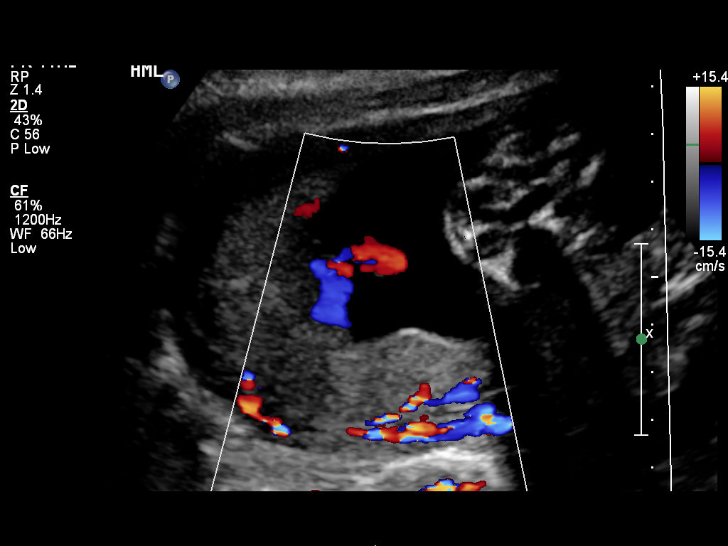
[im 16/82]
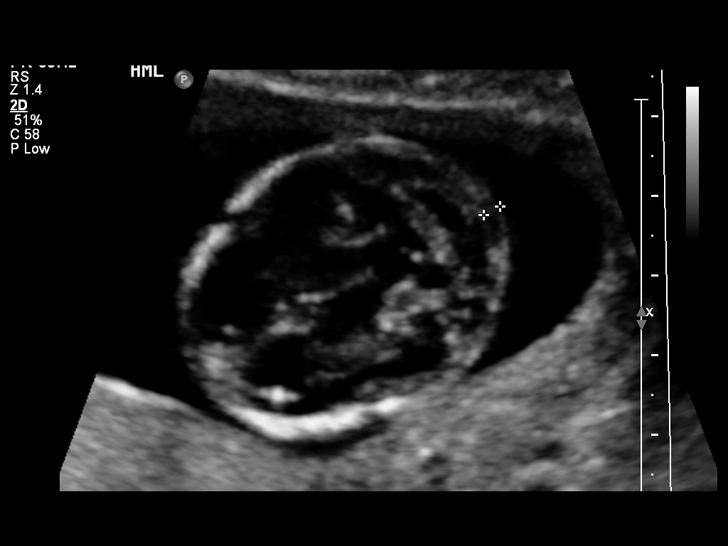
[im 25/82]
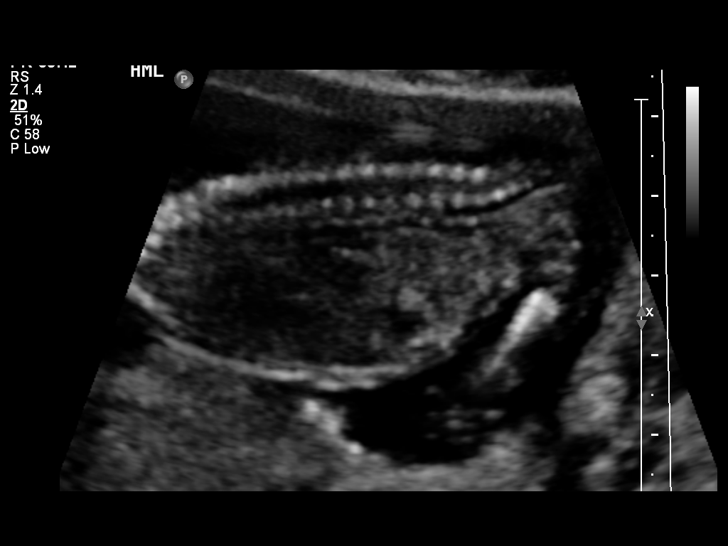
[im 31/82]
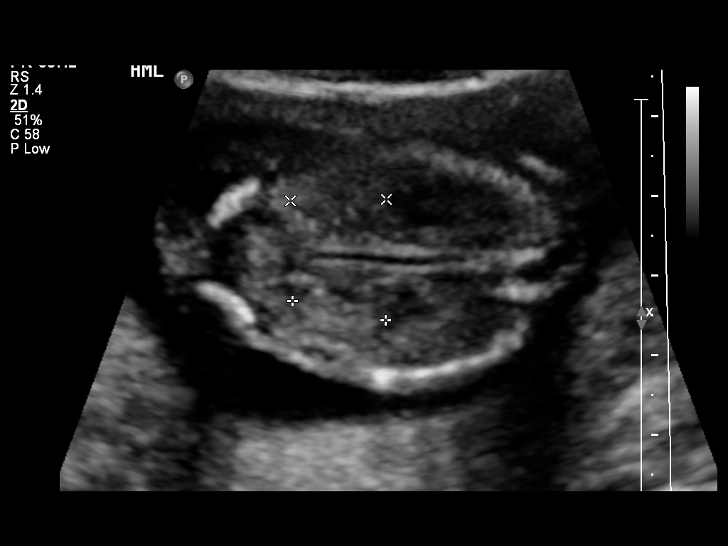
[im 37/82]
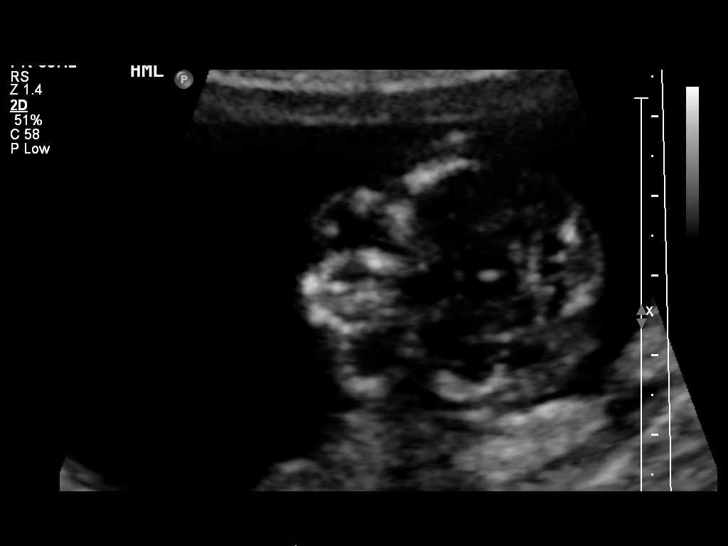
[im 46/82]
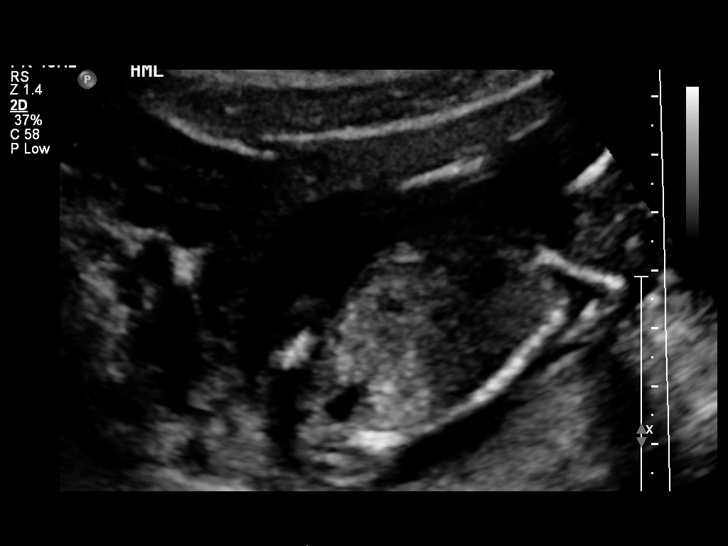
[im 52/82]
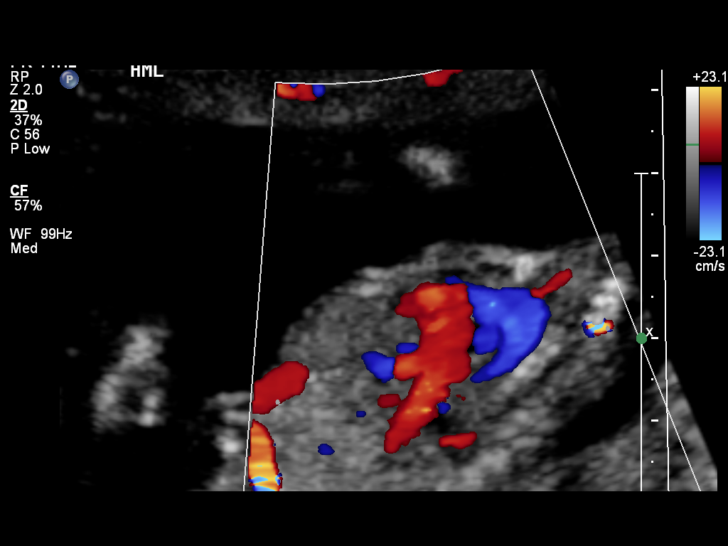
[im 58/82]
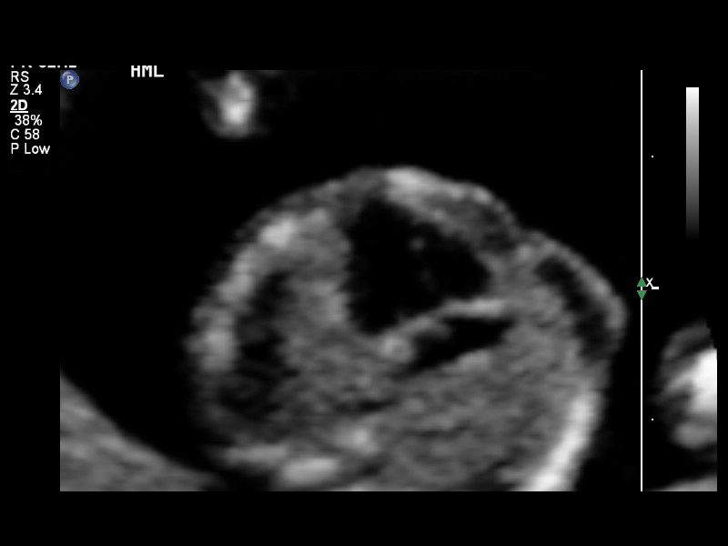
[im 67/82]
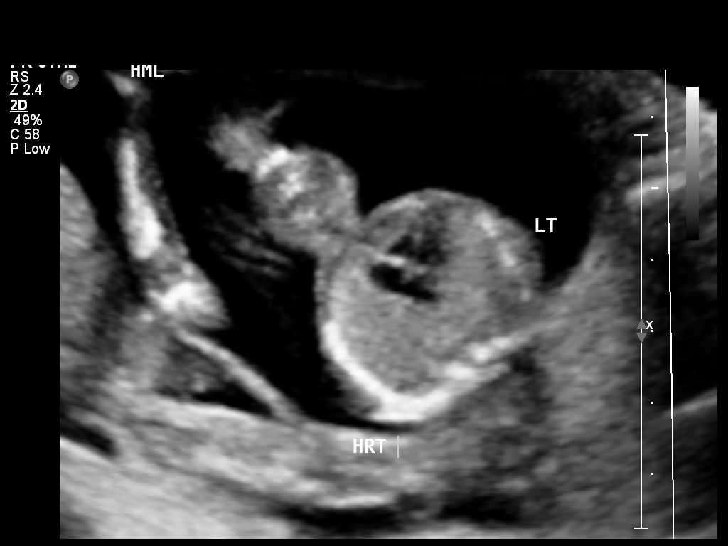
[im 73/82]
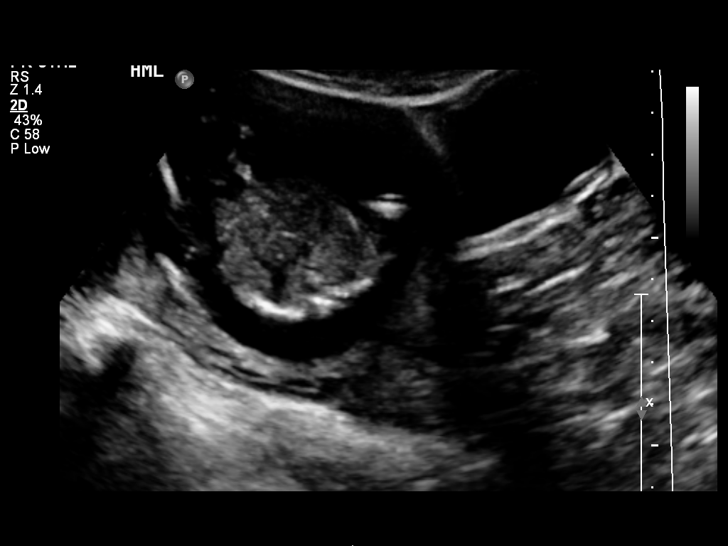
[im 79/82]
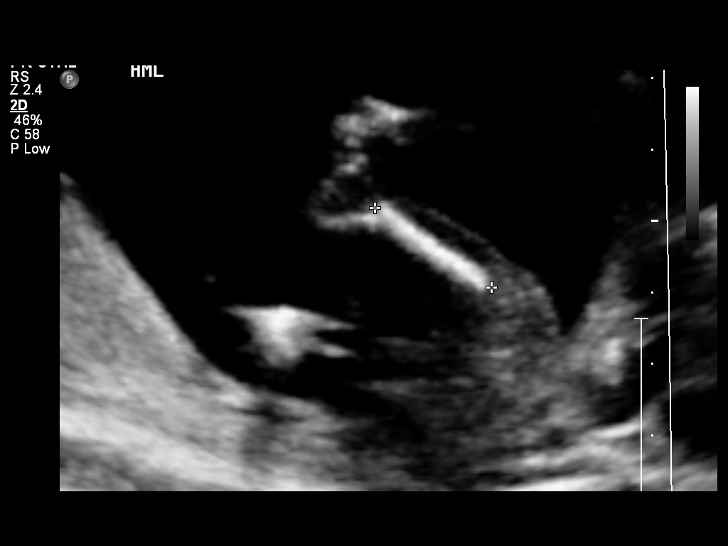

[12 of 28 positions shown; findings below may reference images not displayed]

OBSTETRICS REPORT
                      (Signed Final 06/12/2014 [DATE])

Service(s) Provided

 US OB COMP + 14 WK                                    76805.1
Indications

 16 weeks gestation of pregnancy
 Basic anatomic survey                                 z36
Fetal Evaluation

 Num Of Fetuses:    1
 Fetal Heart Rate:  155                          bpm
 Cardiac Activity:  Observed
 Presentation:      Transverse, head to
                    maternal left
 Placenta:          Posterior, above cervical
                    os
 P. Cord            Visualized
 Insertion:

 Amniotic Fluid
 AFI FV:      Subjectively within normal limits
                                             Larg Pckt:     4.2  cm
Biometry

 BPD:     32.1  mm     G. Age:  16w 0d                CI:        73.35   70 - 86
                                                      FL/HC:      16.5   13.3 -

 HC:     119.1  mm     G. Age:  15w 6d       31  %    HC/AC:      1.12   1.05 -

 AC:     106.4  mm     G. Age:  16w 4d       70  %    FL/BPD:
 FL:      19.7  mm     G. Age:  15w 6d       40  %    FL/AC:      18.5   20 - 24
 NFT:     2.54  mm

 Est. FW:     148  gm      0 lb 5 oz     73  %
Gestational Age

 LMP:           16w 0d        Date:  02/16/14                 EDD:   11/23/14
 U/S Today:     16w 1d                                        EDD:   11/22/14
 Best:          16w 0d     Det. By:  LMP  (02/16/14)          EDD:   11/23/14
Anatomy

 Cranium:          Appears normal         Aortic Arch:      Not well visualized
 Fetal Cavum:      Not well visualized    Ductal Arch:      Not well visualized
 Ventricles:       Appears normal         Diaphragm:        Appears normal
 Choroid Plexus:   Appears normal         Stomach:          Appears normal, left
                                                            sided
 Cerebellum:       Appears normal         Abdomen:          Appears normal
 Posterior Fossa:  Appears normal         Abdominal Wall:   Appears nml (cord
                                                            insert, abd wall)
 Nuchal Fold:      Appears normal         Cord Vessels:     Appears normal (3
                                                            vessel cord)
 Face:             Appears normal         Kidneys:          Not well visualized
                   (orbits and profile)
 Lips:             Not well visualized    Bladder:          Appears normal
 Heart:            Not well visualized    Spine:            Distal spine not well
                                                            visualized
 RVOT:             Not well visualized    Lower             Appears normal
                                          Extremities:
 LVOT:             Not well visualized    Upper             Appears normal
                                          Extremities:

 Other:  Fetus appears to be a female. Heels visualized.
Targeted Anatomy

 Fetal Central Nervous System
 Cisterna Magna:
Cervix Uterus Adnexa

 Cervical Length:    3        cm

 Cervix:       Normal appearance by transabdominal scan.

 Left Ovary:    Size(cm) L: 1.79 x W: 1.4 x H: 0.79  Volume(cc): 1
 Right Ovary:   Size(cm) L: 3.02 x W: 2.53 x H: 2.02  Volume(cc):
Impression

 SIUP at 16+0 weeks
 Normal but limited detailed fetal anatomy; no gross
 abnormalities identified
 Markers of aneuploidy: none
 Normal amniotic fluid volume
 Measurements consistent with LMP dating
Recommendations

 Follow-up ultrasound in 4-6 weeks to complete anatomy
 survey

 questions or concerns.
